# Patient Record
Sex: Female | Born: 1970 | Race: White | Hispanic: No | Marital: Married | Smoking: Never smoker
Health system: Southern US, Community
[De-identification: ages and names within clinical notes are randomized; demographics above are authoritative.]

## PROBLEM LIST (undated history)

## (undated) DIAGNOSIS — Z789 Other specified health status: Secondary | ICD-10-CM

## (undated) DIAGNOSIS — T7840XA Allergy, unspecified, initial encounter: Secondary | ICD-10-CM

## (undated) HISTORY — PX: BREAST EXCISIONAL BIOPSY: SUR124

## (undated) HISTORY — PX: HERNIA REPAIR: SHX51

## (undated) HISTORY — PX: INTRAUTERINE DEVICE (IUD) INSERTION: SHX5877

## (undated) HISTORY — DX: Allergy, unspecified, initial encounter: T78.40XA

---

## 2006-10-01 ENCOUNTER — Other Ambulatory Visit: Admission: RE | Admit: 2006-10-01 | Discharge: 2006-10-01 | Payer: Self-pay | Admitting: Internal Medicine

## 2007-05-20 ENCOUNTER — Encounter: Admission: RE | Admit: 2007-05-20 | Discharge: 2007-05-20 | Payer: Self-pay | Admitting: Internal Medicine

## 2010-10-09 ENCOUNTER — Other Ambulatory Visit: Payer: Self-pay | Admitting: Diagnostic Radiology

## 2010-10-09 DIAGNOSIS — N6320 Unspecified lump in the left breast, unspecified quadrant: Secondary | ICD-10-CM

## 2010-10-10 ENCOUNTER — Ambulatory Visit
Admission: RE | Admit: 2010-10-10 | Discharge: 2010-10-10 | Disposition: A | Discharge status: Home / Self Care | Payer: PRIVATE HEALTH INSURANCE | Source: Ambulatory Visit | Attending: Diagnostic Radiology | Admitting: Diagnostic Radiology

## 2010-10-10 ENCOUNTER — Other Ambulatory Visit: Payer: Self-pay | Admitting: Obstetrics and Gynecology

## 2010-10-10 DIAGNOSIS — N6320 Unspecified lump in the left breast, unspecified quadrant: Secondary | ICD-10-CM

## 2012-03-03 ENCOUNTER — Other Ambulatory Visit: Payer: Self-pay | Admitting: Obstetrics & Gynecology

## 2012-03-03 DIAGNOSIS — Z1231 Encounter for screening mammogram for malignant neoplasm of breast: Secondary | ICD-10-CM

## 2012-03-31 ENCOUNTER — Ambulatory Visit
Admission: RE | Admit: 2012-03-31 | Discharge: 2012-03-31 | Disposition: A | Discharge status: Home / Self Care | Payer: PRIVATE HEALTH INSURANCE | Source: Ambulatory Visit | Attending: Obstetrics & Gynecology | Admitting: Obstetrics & Gynecology

## 2012-03-31 DIAGNOSIS — Z1231 Encounter for screening mammogram for malignant neoplasm of breast: Secondary | ICD-10-CM

## 2012-04-01 ENCOUNTER — Other Ambulatory Visit: Payer: Self-pay | Admitting: Obstetrics & Gynecology

## 2012-04-01 DIAGNOSIS — R928 Other abnormal and inconclusive findings on diagnostic imaging of breast: Secondary | ICD-10-CM

## 2012-04-07 ENCOUNTER — Ambulatory Visit
Admission: RE | Admit: 2012-04-07 | Discharge: 2012-04-07 | Disposition: A | Discharge status: Home / Self Care | Payer: PRIVATE HEALTH INSURANCE | Source: Ambulatory Visit | Attending: Obstetrics & Gynecology | Admitting: Obstetrics & Gynecology

## 2012-04-07 DIAGNOSIS — R928 Other abnormal and inconclusive findings on diagnostic imaging of breast: Secondary | ICD-10-CM

## 2013-05-25 ENCOUNTER — Other Ambulatory Visit: Payer: Self-pay

## 2013-05-25 DIAGNOSIS — Z1231 Encounter for screening mammogram for malignant neoplasm of breast: Secondary | ICD-10-CM

## 2013-06-02 ENCOUNTER — Ambulatory Visit
Admission: RE | Admit: 2013-06-02 | Discharge: 2013-06-02 | Disposition: A | Discharge status: Home / Self Care | Payer: PRIVATE HEALTH INSURANCE | Source: Ambulatory Visit

## 2013-06-02 DIAGNOSIS — Z1231 Encounter for screening mammogram for malignant neoplasm of breast: Secondary | ICD-10-CM

## 2014-12-01 ENCOUNTER — Other Ambulatory Visit: Payer: Self-pay

## 2014-12-01 DIAGNOSIS — Z1231 Encounter for screening mammogram for malignant neoplasm of breast: Secondary | ICD-10-CM

## 2015-01-04 ENCOUNTER — Ambulatory Visit
Admission: RE | Admit: 2015-01-04 | Discharge: 2015-01-04 | Disposition: A | Discharge status: Home / Self Care | Payer: PRIVATE HEALTH INSURANCE | Source: Ambulatory Visit

## 2015-01-04 DIAGNOSIS — Z1231 Encounter for screening mammogram for malignant neoplasm of breast: Secondary | ICD-10-CM

## 2015-01-09 ENCOUNTER — Other Ambulatory Visit: Payer: Self-pay | Admitting: Obstetrics & Gynecology

## 2015-01-09 DIAGNOSIS — R928 Other abnormal and inconclusive findings on diagnostic imaging of breast: Secondary | ICD-10-CM

## 2015-01-16 ENCOUNTER — Ambulatory Visit
Admission: RE | Admit: 2015-01-16 | Discharge: 2015-01-16 | Disposition: A | Discharge status: Home / Self Care | Payer: PRIVATE HEALTH INSURANCE | Source: Ambulatory Visit | Attending: Obstetrics & Gynecology | Admitting: Obstetrics & Gynecology

## 2015-01-16 DIAGNOSIS — R928 Other abnormal and inconclusive findings on diagnostic imaging of breast: Secondary | ICD-10-CM

## 2017-01-21 ENCOUNTER — Other Ambulatory Visit: Payer: Self-pay | Admitting: Obstetrics & Gynecology

## 2017-01-21 DIAGNOSIS — Z1231 Encounter for screening mammogram for malignant neoplasm of breast: Secondary | ICD-10-CM

## 2017-02-25 ENCOUNTER — Ambulatory Visit
Admission: RE | Admit: 2017-02-25 | Discharge: 2017-02-25 | Disposition: A | Discharge status: Home / Self Care | Payer: PRIVATE HEALTH INSURANCE | Source: Ambulatory Visit | Attending: Obstetrics & Gynecology | Admitting: Obstetrics & Gynecology

## 2017-02-25 DIAGNOSIS — Z1231 Encounter for screening mammogram for malignant neoplasm of breast: Secondary | ICD-10-CM

## 2017-03-06 ENCOUNTER — Encounter: Payer: Self-pay | Admitting: Obstetrics & Gynecology

## 2017-03-06 ENCOUNTER — Ambulatory Visit: Payer: PRIVATE HEALTH INSURANCE | Admitting: Obstetrics & Gynecology

## 2017-03-06 VITALS — BP 110/70 | Ht 63.75 in | Wt 147.0 lb

## 2017-03-06 DIAGNOSIS — Z30431 Encounter for routine checking of intrauterine contraceptive device: Secondary | ICD-10-CM | POA: Diagnosis not present

## 2017-03-06 DIAGNOSIS — Z9189 Other specified personal risk factors, not elsewhere classified: Secondary | ICD-10-CM

## 2017-03-06 DIAGNOSIS — Z01419 Encounter for gynecological examination (general) (routine) without abnormal findings: Secondary | ICD-10-CM | POA: Diagnosis not present

## 2017-03-06 NOTE — Progress Notes (Addendum)
Lori Santiago 02-Dec-1970 [id number]   History:    47 y.o.G2P2L2 Married.  Husband is an Medical sales representative.  Vasectomy.  2 daughters 79 and 18 yo  RP:  Established patient presenting  for annual gyn exam   HPI: Well on Mirena IUD since March 2014.  Patient will come back March this year to change the Mirena IUD.  Using it for cycle control.  Husband is vasectomized.  No pelvic pain.  No problem with intercourse.  Urine and bowel movements normal.  Breasts normal.  Fit and eating well.  Health labs with family physician.  Body mass index 25.4.  Past medical history,surgical history, family history and social history were all reviewed and documented in the EPIC chart.  Gynecologic History No LMP recorded. Patient is not currently having periods (Reason: Other). Contraception: Mirena IUD x 04/2012 Last Pap: 11/2015. Results were: Negative, HPV HR neg Last mammogram: 02/2017. Results were: Negative  Obstetric History OB History  Gravida Para Term Preterm AB Living  2 2       2   SAB TAB Ectopic Multiple Live Births               # Outcome Date GA Lbr Len/2nd Weight Sex Delivery Anes PTL Lv  2 Para           1 Para                ROS: A ROS was performed and pertinent positives and negatives are included in the history.  GENERAL: No fevers or chills. HEENT: No change in vision, no earache, sore throat or sinus congestion. NECK: No pain or stiffness. CARDIOVASCULAR: No chest pain or pressure. No palpitations. PULMONARY: No shortness of breath, cough or wheeze. GASTROINTESTINAL: No abdominal pain, nausea, vomiting or diarrhea, melena or bright red blood per rectum. GENITOURINARY: No urinary frequency, urgency, hesitancy or dysuria. MUSCULOSKELETAL: No joint or muscle pain, no back pain, no recent trauma. DERMATOLOGIC: No rash, no itching, no lesions. ENDOCRINE: No polyuria, polydipsia, no heat or cold intolerance. No recent change in weight. HEMATOLOGICAL: No anemia or easy bruising  or bleeding. NEUROLOGIC: No headache, seizures, numbness, tingling or weakness. PSYCHIATRIC: No depression, no loss of interest in normal activity or change in sleep pattern.     Exam:   BP 110/70   Ht 5' 3.75" (1.619 m)   Wt 147 lb (66.7 kg)   BMI 25.43 kg/m   Body mass index is 25.43 kg/m.  General appearance : Well developed well nourished female. No acute distress HEENT: Eyes: no retinal hemorrhage or exudates,  Neck supple, trachea midline, no carotid bruits, no thyroidmegaly Lungs: Clear to auscultation, no rhonchi or wheezes, or rib retractions  Heart: Regular rate and rhythm, no murmurs or gallops Breast:Examined in sitting and supine position were symmetrical in appearance, no palpable masses or tenderness,  no skin retraction, no nipple inversion, no nipple discharge, no skin discoloration, no axillary or supraclavicular lymphadenopathy Abdomen: no palpable masses or tenderness, no rebound or guarding Extremities: no edema or skin discoloration or tenderness  Pelvic: Vulva normal  Bartholin, Urethra, Skene Glands: Within normal limits             Vagina: No gross lesions or discharge  Cervix: No gross lesions or discharge.  Strings felt at [facility].  Uterus RV, normal size, shape and consistency, non-tender and mobile  Adnexa  Without masses or tenderness  Anus and perineum  normal    Assessment/Plan:  47 y.o. female for  annual exam   1. Well female exam with routine gynecological exam Normal gynecologic exam.  Uterus retroverted.  Pap test negative with negative high risk HPV October 2017.  Breast exam normal.  Screening mammogram negative January 2019.  Health labs with family physician.  2. Encounter for routine checking of intrauterine contraceptive device (IUD) Mirena IUD well-tolerated, in good position and controlling cycle well.  Due for change in Marinol IUD March 2019.  Will organize that visit today.  3. Relies on partner vasectomy for  contraception   Princess Bruins MD, 12:34 PM 03/06/2017

## 2017-03-06 NOTE — Patient Instructions (Signed)
1. Well female exam with routine gynecological exam Normal gynecologic exam.  Uterus retroverted.  Pap test negative with negative high risk HPV October 2017.  Breast exam normal.  Screening mammogram negative January 2019.  Health labs with family physician.  2. Encounter for routine checking of intrauterine contraceptive device (IUD) Mirena IUD well-tolerated, in good position and controlling cycle well.  Due for change in Marinol IUD March 2019.  Will organize that visit today.  3. Relies on partner vasectomy for contraception   Lori Santiago, it was a pleasure seeing you today!  Health Maintenance, Female Adopting a healthy lifestyle and getting preventive care can go a long way to promote health and wellness. Talk with your health care provider about what schedule of regular examinations is right for you. This is a good chance for you to check in with your provider about disease prevention and staying healthy. In between checkups, there are plenty of things you can do on your own. Experts have done a lot of research about which lifestyle changes and preventive measures are most likely to keep you healthy. Ask your health care provider for more information. Weight and diet Eat a healthy diet  Be sure to include plenty of vegetables, fruits, low-fat dairy products, and lean protein.  Do not eat a lot of foods high in solid fats, added sugars, or salt.  Get regular exercise. This is one of the most important things you can do for your health. ? Most adults should exercise for at least 150 minutes each week. The exercise should increase your heart rate and make you sweat (moderate-intensity exercise). ? Most adults should also do strengthening exercises at least twice a week. This is in addition to the moderate-intensity exercise.  Maintain a healthy weight  Body mass index (BMI) is a measurement that can be used to identify possible weight problems. It estimates body fat based on height and  weight. Your health care provider can help determine your BMI and help you achieve or maintain a healthy weight.  For females 71 years of age and older: ? A BMI below 18.5 is considered underweight. ? A BMI of 18.5 to 24.9 is normal. ? A BMI of 25 to 29.9 is considered overweight. ? A BMI of 30 and above is considered obese.  Watch levels of cholesterol and blood lipids  You should start having your blood tested for lipids and cholesterol at 47 years of age, then have this test every 5 years.  You may need to have your cholesterol levels checked more often if: ? Your lipid or cholesterol levels are high. ? You are older than 47 years of age. ? You are at high risk for heart disease.  Cancer screening Lung Cancer  Lung cancer screening is recommended for adults 1-12 years old who are at high risk for lung cancer because of a history of smoking.  A yearly low-dose CT scan of the lungs is recommended for people who: ? Currently smoke. ? Have quit within the past 15 years. ? Have at least a 30-pack-year history of smoking. A pack year is smoking an average of one pack of cigarettes a day for 1 year.  Yearly screening should continue until it has been 15 years since you quit.  Yearly screening should stop if you develop a health problem that would prevent you from having lung cancer treatment.  Breast Cancer  Practice breast self-awareness. This means understanding how your breasts normally appear and feel.  It also means  doing regular breast self-exams. Let your health care provider know about any changes, no matter how small.  If you are in your 20s or 30s, you should have a clinical breast exam (CBE) by a health care provider every 1-3 years as part of a regular health exam.  If you are 40 or older, have a CBE every year. Also consider having a breast X-ray (mammogram) every year.  If you have a family history of breast cancer, talk to your health care provider about genetic  screening.  If you are at high risk for breast cancer, talk to your health care provider about having an MRI and a mammogram every year.  Breast cancer gene (BRCA) assessment is recommended for women who have family members with BRCA-related cancers. BRCA-related cancers include: ? Breast. ? Ovarian. ? Tubal. ? Peritoneal cancers.  Results of the assessment will determine the need for genetic counseling and BRCA1 and BRCA2 testing.  Cervical Cancer Your health care provider may recommend that you be screened regularly for cancer of the pelvic organs (ovaries, uterus, and vagina). This screening involves a pelvic examination, including checking for microscopic changes to the surface of your cervix (Pap test). You may be encouraged to have this screening done every 3 years, beginning at age 22.  For women ages 57-65, health care providers may recommend pelvic exams and Pap testing every 3 years, or they may recommend the Pap and pelvic exam, combined with testing for human papilloma virus (HPV), every 5 years. Some types of HPV increase your risk of cervical cancer. Testing for HPV may also be done on women of any age with unclear Pap test results.  Other health care providers may not recommend any screening for nonpregnant women who are considered low risk for pelvic cancer and who do not have symptoms. Ask your health care provider if a screening pelvic exam is right for you.  If you have had past treatment for cervical cancer or a condition that could lead to cancer, you need Pap tests and screening for cancer for at least 20 years after your treatment. If Pap tests have been discontinued, your risk factors (such as having a new sexual partner) need to be reassessed to determine if screening should resume. Some women have medical problems that increase the chance of getting cervical cancer. In these cases, your health care provider may recommend more frequent screening and Pap  tests.  Colorectal Cancer  This type of cancer can be detected and often prevented.  Routine colorectal cancer screening usually begins at 47 years of age and continues through 47 years of age.  Your health care provider may recommend screening at an earlier age if you have risk factors for colon cancer.  Your health care provider may also recommend using home test kits to check for hidden blood in the stool.  A small camera at the end of a tube can be used to examine your colon directly (sigmoidoscopy or colonoscopy). This is done to check for the earliest forms of colorectal cancer.  Routine screening usually begins at age 15.  Direct examination of the colon should be repeated every 5-10 years through 47 years of age. However, you may need to be screened more often if early forms of precancerous polyps or small growths are found.  Skin Cancer  Check your skin from head to toe regularly.  Tell your health care provider about any new moles or changes in moles, especially if there is a change in a  mole's shape or color.  Also tell your health care provider if you have a mole that is larger than the size of a pencil eraser.  Always use sunscreen. Apply sunscreen liberally and repeatedly throughout the day.  Protect yourself by wearing long sleeves, pants, a wide-brimmed hat, and sunglasses whenever you are outside.  Heart disease, diabetes, and high blood pressure  High blood pressure causes heart disease and increases the risk of stroke. High blood pressure is more likely to develop in: ? People who have blood pressure in the high end of the normal range (130-139/85-89 mm Hg). ? People who are overweight or obese. ? People who are African American.  If you are 78-95 years of age, have your blood pressure checked every 3-5 years. If you are 28 years of age or older, have your blood pressure checked every year. You should have your blood pressure measured twice-once when you are at  a hospital or clinic, and once when you are not at a hospital or clinic. Record the average of the two measurements. To check your blood pressure when you are not at a hospital or clinic, you can use: ? An automated blood pressure machine at a pharmacy. ? A home blood pressure monitor.  If you are between 10 years and 62 years old, ask your health care provider if you should take aspirin to prevent strokes.  Have regular diabetes screenings. This involves taking a blood sample to check your fasting blood sugar level. ? If you are at a normal weight and have a low risk for diabetes, have this test once every three years after 47 years of age. ? If you are overweight and have a high risk for diabetes, consider being tested at a younger age or more often. Preventing infection Hepatitis B  If you have a higher risk for hepatitis B, you should be screened for this virus. You are considered at high risk for hepatitis B if: ? You were born in a country where hepatitis B is common. Ask your health care provider which countries are considered high risk. ? Your parents were born in a high-risk country, and you have not been immunized against hepatitis B (hepatitis B vaccine). ? You have HIV or AIDS. ? You use needles to inject street drugs. ? You live with someone who has hepatitis B. ? You have had sex with someone who has hepatitis B. ? You get hemodialysis treatment. ? You take certain medicines for conditions, including cancer, organ transplantation, and autoimmune conditions.  Hepatitis C  Blood testing is recommended for: ? Everyone born from [birth year] through 1965. ? Anyone with known risk factors for hepatitis C.  Sexually transmitted infections (STIs)  You should be screened for sexually transmitted infections (STIs) including gonorrhea and chlamydia if: ? You are sexually active and are younger than 47 years of age. ? You are older than 47 years of age and your health care provider tells  you that you are at risk for this type of infection. ? Your sexual activity has changed since you were last screened and you are at an increased risk for chlamydia or gonorrhea. Ask your health care provider if you are at risk.  If you do not have HIV, but are at risk, it may be recommended that you take a prescription medicine daily to prevent HIV infection. This is called pre-exposure prophylaxis (PrEP). You are considered at risk if: ? You are sexually active and do not regularly use condoms or  know the HIV status of your partner(s). ? You take drugs by injection. ? You are sexually active with a partner who has HIV.  Talk with your health care provider about whether you are at high risk of being infected with HIV. If you choose to begin PrEP, you should first be tested for HIV. You should then be tested every 3 months for as long as you are taking PrEP. Pregnancy  If you are premenopausal and you may become pregnant, ask your health care provider about preconception counseling.  If you may become pregnant, take 400 to 800 micrograms (mcg) of folic acid every day.  If you want to prevent pregnancy, talk to your health care provider about birth control (contraception). Osteoporosis and menopause  Osteoporosis is a disease in which the bones lose minerals and strength with aging. This can result in serious bone fractures. Your risk for osteoporosis can be identified using a bone density scan.  If you are 81 years of age or older, or if you are at risk for osteoporosis and fractures, ask your health care provider if you should be screened.  Ask your health care provider whether you should take a calcium or vitamin D supplement to lower your risk for osteoporosis.  Menopause may have certain physical symptoms and risks.  Hormone replacement therapy may reduce some of these symptoms and risks. Talk to your health care provider about whether hormone replacement therapy is right for  you. Follow these instructions at home:  Schedule regular health, dental, and eye exams.  Stay current with your immunizations.  Do not use any tobacco products including cigarettes, chewing tobacco, or electronic cigarettes.  If you are pregnant, do not drink alcohol.  If you are breastfeeding, limit how much and how often you drink alcohol.  Limit alcohol intake to no more than 1 drink per day for nonpregnant women. One drink equals 12 ounces of beer, 5 ounces of wine, or 1 ounces of hard liquor.  Do not use street drugs.  Do not share needles.  Ask your health care provider for help if you need support or information about quitting drugs.  Tell your health care provider if you often feel depressed.  Tell your health care provider if you have ever been abused or do not feel safe at home. This information is not intended to replace advice given to you by your health care provider. Make sure you discuss any questions you have with your health care provider. Document Released: 08/13/2010 Document Revised: 07/06/2015 Document Reviewed: 11/01/2014 Elsevier Interactive Patient Education  [employer].

## 2017-04-18 ENCOUNTER — Ambulatory Visit: Payer: PRIVATE HEALTH INSURANCE | Admitting: Obstetrics & Gynecology

## 2017-04-18 ENCOUNTER — Encounter: Payer: Self-pay | Admitting: Obstetrics & Gynecology

## 2017-04-18 VITALS — BP 118/78

## 2017-04-18 DIAGNOSIS — Z30433 Encounter for removal and reinsertion of intrauterine contraceptive device: Secondary | ICD-10-CM

## 2017-04-18 NOTE — Patient Instructions (Signed)
1. Encounter for removal and reinsertion of intrauterine contraceptive device (IUD) Removal of Mirena IUD after 5 years.  Reinsertion of a new Mirena IUD without difficulty.  Mirena IUD use for cycle control, as her husband is vasectomized.  Procedures without complication.  Follow-up at 4 weeks for IUD check.  IUD PLACEMENT POST-PROCEDURE INSTRUCTIONS  1. You may take Ibuprofen, Aleve or Tylenol for pain if needed.  Cramping should resolve within in 24 hours.  2. You may have a small amount of spotting.  You should wear a mini pad for the next few days.  3. You may have intercourse after 24 hours.  If you using this for birth control, it is effective immediately.  4. You need to call if you have any pelvic pain, fever, heavy bleeding or foul smelling vaginal discharge.  Irregular bleeding is common the first several months after having an IUD placed. You do not need to call for this reason unless you are concerned.  5. Shower or bathe as normal  6. You should have a follow-up appointment in 4-8 weeks for a re-check to make sure you are not having any problems.

## 2017-04-18 NOTE — Progress Notes (Signed)
    Lori Santiago [birth year] [id number]        47 y.o.  G2P2 Married.  Vasectomy  RP: Time to remove and reinsert a new Mirena IUD  HPI: Well on Mirena IUD x 5 years.  Used for cycle control.   OB History  Gravida Para Term Preterm AB Living  2 2       2   SAB TAB Ectopic Multiple Live Births               # Outcome Date GA Lbr Len/2nd Weight Sex Delivery Anes PTL Lv  2 Para           1 Para               Past medical history,surgical history, problem list, medications, allergies, family history and social history were all reviewed and documented in the EPIC chart.   Directed ROS with pertinent positives and negatives documented in the history of present illness/assessment and plan.  Exam:  Vitals:   04/18/17 1009  BP: 118/78   General appearance:  Normal                                                                    IUD procedure note       Patient presented to the office today for removal and placement of Mirena IUD. The patient had previously been provided with literature information on this method of contraception. The risks benefits and pros and cons were discussed and all her questions were answered. She is fully aware that this form of contraception is 99% effective and is good for 5 years.  Pelvic exam: Vulva normal Vagina: No lesions or discharge Cervix: No lesions or discharge.  IUD strings visible.  IUD removed easily by pulling on strings with a Boseman clamp.  IUD complete/intact.  Shown to patient and discarded. Uterus: RV position, normal size, mobile Adnexa: No masses or tenderness Rectal exam: Not done  The cervix was cleansed with Betadine solution. Hurricane spray on the cervix.  A single-tooth tenaculum was placed on the anterior cervical lip. The IUD was shown to the patient and inserted in a sterile fashion.  Hysterometry with the IUD as being inserted was 7 cm.  The IUD string was trimmed. The single-tooth tenaculum was removed. Patient was  instructed to return back to the office in one month for follow up.       Assessment/Plan:  47 y.o. G2P2   1. Encounter for removal and reinsertion of intrauterine contraceptive device (IUD) Removal of Mirena IUD after 5 years.  Reinsertion of a new Mirena IUD without difficulty.  Mirena IUD use for cycle control, as her husband is vasectomized.  Procedures without complication.  Follow-up at 4 weeks for IUD check.  Princess Bruins MD, 10:14 AM 04/18/2017

## 2017-04-21 ENCOUNTER — Encounter: Payer: Self-pay | Admitting: Anesthesiology

## 2017-05-13 ENCOUNTER — Encounter: Payer: Self-pay | Admitting: Obstetrics & Gynecology

## 2017-05-13 ENCOUNTER — Ambulatory Visit (INDEPENDENT_AMBULATORY_CARE_PROVIDER_SITE_OTHER): Payer: PRIVATE HEALTH INSURANCE | Admitting: Obstetrics & Gynecology

## 2017-05-13 VITALS — BP 122/78

## 2017-05-13 DIAGNOSIS — Z30431 Encounter for routine checking of intrauterine contraceptive device: Secondary | ICD-10-CM | POA: Diagnosis not present

## 2017-05-13 NOTE — Patient Instructions (Signed)
1. IUD check up Mirena IUD well-tolerated, in good position and with no sign of infection.  Patient reassured.  Follow-up annual gynecologic exam January 2020.  Levada Dy, good seeing you today!

## 2017-05-13 NOTE — Progress Notes (Signed)
    Lori Santiago October 03, 1970 [id number]        46 y.o.  G2P2   RP: 4-week IUD check  HPI: Mirena IUD inserted April 18, 2017.  No abnormal bleeding.  Normal vaginal secretions.  No pelvic pain.  No pain with intercourse.  No fever.   OB History  Gravida Para Term Preterm AB Living  2 2       2   SAB TAB Ectopic Multiple Live Births               # Outcome Date GA Lbr Len/2nd Weight Sex Delivery Anes PTL Lv  2 Para           1 Para             Past medical history,surgical history, problem list, medications, allergies, family history and social history were all reviewed and documented in the EPIC chart.   Directed ROS with pertinent positives and negatives documented in the history of present illness/assessment and plan.  Exam:  Vitals:   05/13/17 1428  BP: 122/78   General appearance:  Normal  Abdomen: Normal  Gynecologic exam: Vulva normal.  Speculum: Cervix normal with no erythema.  Strings well seen.  Vagina normal.  Secretions normal.  No blood.   Assessment/Plan:  47 y.o. G2P2   1. IUD check up Mirena IUD well-tolerated, in good position and with no sign of infection.  Patient reassured.  Follow-up annual gynecologic exam January 2020.  Counseling on above issues more than 50% for 15 minutes.  Princess Bruins MD, 2:52 PM 05/13/2017

## 2018-03-06 ENCOUNTER — Ambulatory Visit: Payer: Self-pay | Admitting: Family Medicine

## 2018-03-06 ENCOUNTER — Encounter: Payer: Self-pay | Admitting: Family Medicine

## 2018-03-06 VITALS — BP 110/65 | HR 73 | Temp 98.2°F | Resp 16 | Wt 153.0 lb

## 2018-03-06 DIAGNOSIS — J029 Acute pharyngitis, unspecified: Secondary | ICD-10-CM

## 2018-03-06 LAB — POCT RAPID STREP A (OFFICE): Rapid Strep A Screen: NEGATIVE

## 2018-03-06 NOTE — Progress Notes (Signed)
Lori Santiago is a 48 y.o. female who presents today with 4 days of sore throat and an infrequent non-productive dry cough.  She has attempted treatments of DyQuil and Nyquil with some relief and this has slightly improved her symptoms. She has two teenagers who are otherwise well and denies any household, social or occupational exposures or risk factors.  Review of Systems  Constitutional: Negative for chills, fever and malaise/fatigue.  HENT: Positive for sore throat. Negative for congestion, ear discharge, ear pain and sinus pain.   Eyes: Negative.   Respiratory: Positive for cough. Negative for sputum production and shortness of breath.   Cardiovascular: Negative.  Negative for chest pain.  Gastrointestinal: Negative for abdominal pain, diarrhea, nausea and vomiting.  Genitourinary: Negative for dysuria, frequency, hematuria and urgency.  Musculoskeletal: Negative for myalgias.  Skin: Negative.   Neurological: Negative for headaches.  Endo/Heme/Allergies: Negative.   Psychiatric/Behavioral: Negative.     Lori Santiago has a current medication list which includes the following prescription(s): multivitamin, pseudoeph-doxylamine-dm-apap, and pseudoephedrine-apap-dm. Also has No Known Allergies.  Lori Santiago  has no past medical history on file. Also  has a past surgical history that includes Hernia repair.    O: Vitals:   03/06/18 1046  BP: 110/65  Pulse: 73  Resp: 16  Temp: 98.2 F (36.8 C)  SpO2: 97%     Physical Exam Vitals signs reviewed.  Constitutional:      General: She is not in acute distress.    Appearance: She is well-developed. She is not ill-appearing, toxic-appearing or diaphoretic.  HENT:     Head: Normocephalic.     Salivary Glands: Right salivary gland is not diffusely enlarged or tender. Left salivary gland is not diffusely enlarged or tender.     Right Ear: Hearing, tympanic membrane, ear canal and external ear normal.     Left Ear: Hearing, tympanic membrane, ear  canal and external ear normal.     Nose: Nose normal. No congestion or rhinorrhea.     Right Sinus: No maxillary sinus tenderness or frontal sinus tenderness.     Left Sinus: No maxillary sinus tenderness or frontal sinus tenderness.     Mouth/Throat:     Lips: Pink.     Mouth: Mucous membranes are moist.     Tongue: No lesions. Tongue does not protrude in midline.     Palate: No mass and lesions. Palate does not elevate in midline.     Pharynx: Oropharynx is clear. Uvula midline.     Tonsils: No tonsillar exudate or tonsillar abscesses. Swelling: 0 on the right. 0 on the left.  Neck:     Musculoskeletal: Normal range of motion and neck supple.  Cardiovascular:     Rate and Rhythm: Normal rate and regular rhythm.     Pulses: Normal pulses.     Heart sounds: Normal heart sounds.  Pulmonary:     Effort: Pulmonary effort is normal.     Breath sounds: Normal breath sounds.  Abdominal:     General: Bowel sounds are normal.     Palpations: Abdomen is soft.  Musculoskeletal: Normal range of motion.  Lymphadenopathy:     Head:     Right side of head: Tonsillar adenopathy present. No submental or submandibular adenopathy.     Left side of head: No submental, submandibular or tonsillar adenopathy.     Cervical: No cervical adenopathy.  Neurological:     Mental Status: She is alert and oriented to person, place, and time.  A: 1. Sore throat      P: 1. Sore throat Very normal PE- suspect viral sore throat and discussed supportive care options with patient who agrees with plan and recommendations. Rapid Strep - Negative (centor 1 (no fever, has cough, no exudate, mild lymphnode involvement) Discussed that's ore throat can be precursor of impending illness and if fever develops and/or symptoms persist to bring up at upcoming appointment on 09 Mar 2018. Discussed salt water rinses, warm liquids (tea with honey, lemon, throat lozenges, and schedule tylenol x 48 hours.) - POCT rapid  strep A Results for orders placed or performed in visit on 03/06/18 (from the past 24 hour(s))  POCT rapid strep A     Status: Normal   Collection Time: 03/06/18 10:59 AM  Result Value Ref Range   Rapid Strep A Screen Negative Negative     Other orders - Pseudoephedrine-APAP-DM (DAYQUIL PO); Take by mouth. - Pseudoeph-Doxylamine-DM-APAP (NYQUIL PO); Take by mouth.   Discussed with patient exam findings, suspected diagnosis etiology and  reviewed recommended treatment plan and follow up, including complications and indications for urgent medical follow up and evaluation. Medications including use and indications reviewed with patient. Patient provided relevant patient education on diagnosis and/or relevant related condition that were discussed and reviewed with patient at discharge. Patient verbalized understanding of information provided and agrees with plan of care (POC), all questions answered.

## 2018-03-06 NOTE — Patient Instructions (Signed)
Sore Throat  A sore throat is pain, burning, irritation, or scratchiness in the throat. When you have a sore throat, you may feel pain or tenderness in your throat when you swallow or talk.  Many things can cause a sore throat, including:   An infection.   Seasonal allergies.   Dryness in the air.   Irritants, such as smoke or pollution.   Radiation treatment to the area.   Gastroesophageal reflux disease (GERD).   A tumor.  A sore throat is often the first sign of another sickness. It may happen with other symptoms, such as coughing, sneezing, fever, and swollen neck glands. Most sore throats go away without medical treatment.  Follow these instructions at home:          Take over-the-counter medicines only as told by your health care provider.  ? If your child has a sore throat, do not give your child aspirin because of the association with Reye syndrome.   Drink enough fluids to keep your urine pale yellow.   Rest as needed.   To help with pain, try:  ? Sipping warm liquids, such as broth, herbal tea, or warm water.  ? Eating or drinking cold or frozen liquids, such as frozen ice pops.  ? Gargling with a salt-water mixture 3-4 times a day or as needed. To make a salt-water mixture, completely dissolve -1 tsp (3-6 g) of salt in 1 cup (237 mL) of warm water.  ? Sucking on hard candy or throat lozenges.  ? Putting a cool-mist humidifier in your bedroom at night to moisten the air.  ? Sitting in the bathroom with the door closed for 5-10 minutes while you run hot water in the shower.   Do not use any products that contain nicotine or tobacco, such as cigarettes, e-cigarettes, and chewing tobacco. If you need help quitting, ask your health care provider.   Wash your hands well and often with soap and water. If soap and water are not available, use hand sanitizer.  Contact a health care provider if:   You have a fever for more than 2-3 days.   You have symptoms that last (are persistent) for more than  2-3 days.   Your throat does not get better within 7 days.   You have a fever and your symptoms suddenly get worse.   Your child who is 3 months to 3 years old has a temperature of 102.2F (39C) or higher.  Get help right away if:   You have difficulty breathing.   You cannot swallow fluids, soft foods, or your saliva.   You have increased swelling in your throat or neck.   You have persistent nausea and vomiting.  Summary   A sore throat is pain, burning, irritation, or scratchiness in the throat. Many things can cause a sore throat.   Take over-the-counter medicines only as told by your health care provider. Do not give your child aspirin.   Drink plenty of fluids, and rest as needed.   Contact a health care provider if your symptoms worsen or your sore throat does not get better within 7 days.  This information is not intended to replace advice given to you by your health care provider. Make sure you discuss any questions you have with your health care provider.  Document Released: 03/07/2004 Document Revised: 06/30/2017 Document Reviewed: 06/30/2017  Elsevier Interactive Patient Education  2019 [employer]

## 2018-03-09 ENCOUNTER — Ambulatory Visit (INDEPENDENT_AMBULATORY_CARE_PROVIDER_SITE_OTHER): Payer: BLUE CROSS/BLUE SHIELD | Admitting: Obstetrics & Gynecology

## 2018-03-09 ENCOUNTER — Encounter: Payer: Self-pay | Admitting: Obstetrics & Gynecology

## 2018-03-09 VITALS — BP 108/68 | Ht 63.75 in | Wt 143.0 lb

## 2018-03-09 DIAGNOSIS — Z30431 Encounter for routine checking of intrauterine contraceptive device: Secondary | ICD-10-CM | POA: Diagnosis not present

## 2018-03-09 DIAGNOSIS — Z01419 Encounter for gynecological examination (general) (routine) without abnormal findings: Secondary | ICD-10-CM | POA: Diagnosis not present

## 2018-03-09 NOTE — Progress Notes (Signed)
Lori Santiago [birth year] [id number]   History:    48 y.o. G2P2L2 Married.  Vasectomy.  Daughters 58 and 65 yo.  RP:  Established patient presenting for annual gyn exam   HPI: Well on Mirena IUD inserted 04/18/2017.  No abnormal bleeding, no pelvic pain.  No pain with intercourse.  Urine and bowel movements normal.  Breasts normal.  Body mass index 24.74.  Fit and healthy nutrition.  Health labs with family physician.  Past medical history,surgical history, family history and social history were all reviewed and documented in the EPIC chart.  Gynecologic History No LMP recorded. (Menstrual status: IUD). Contraception: Mirena IUD x 04/2017 Last Pap: 2017. Results were: Normal per patient Last mammogram: 02/2017. Results were: Negative Bone Density: Never Colonoscopy: Never  Obstetric History OB History  Gravida Para Term Preterm AB Living  2 2       2   SAB TAB Ectopic Multiple Live Births               # Outcome Date GA Lbr Len/2nd Weight Sex Delivery Anes PTL Lv  2 Para           1 Para              ROS: A ROS was performed and pertinent positives and negatives are included in the history.  GENERAL: No fevers or chills. HEENT: No change in vision, no earache, sore throat or sinus congestion. NECK: No pain or stiffness. CARDIOVASCULAR: No chest pain or pressure. No palpitations. PULMONARY: No shortness of breath, cough or wheeze. GASTROINTESTINAL: No abdominal pain, nausea, vomiting or diarrhea, melena or bright red blood per rectum. GENITOURINARY: No urinary frequency, urgency, hesitancy or dysuria. MUSCULOSKELETAL: No joint or muscle pain, no back pain, no recent trauma. DERMATOLOGIC: No rash, no itching, no lesions. ENDOCRINE: No polyuria, polydipsia, no heat or cold intolerance. No recent change in weight. HEMATOLOGICAL: No anemia or easy bruising or bleeding. NEUROLOGIC: No headache, seizures, numbness, tingling or weakness. PSYCHIATRIC: No depression, no loss of interest in normal  activity or change in sleep pattern.     Exam:   BP 108/68   Ht 5' 3.75" (1.619 m)   Wt 143 lb (64.9 kg)   BMI 24.74 kg/m   Body mass index is 24.74 kg/m.  General appearance : Well developed well nourished female. No acute distress HEENT: Eyes: no retinal hemorrhage or exudates,  Neck supple, trachea midline, no carotid bruits, no thyroidmegaly Lungs: Clear to auscultation, no rhonchi or wheezes, or rib retractions  Heart: Regular rate and rhythm, no murmurs or gallops Breast:Examined in sitting and supine position were symmetrical in appearance, no palpable masses or tenderness,  no skin retraction, no nipple inversion, no nipple discharge, no skin discoloration, no axillary or supraclavicular lymphadenopathy Abdomen: no palpable masses or tenderness, no rebound or guarding Extremities: no edema or skin discoloration or tenderness  Pelvic: Vulva: Normal             Vagina: No gross lesions or discharge  Cervix: No gross lesions or discharge.  IUD strings visible.  Pap reflex done.  Uterus RV, normal size, shape and consistency, non-tender and mobile  Adnexa  Without masses or tenderness  Anus: Normal   Assessment/Plan:  48 y.o. female for annual exam   1. Encounter for routine gynecological examination with Papanicolaou smear of cervix Normal gynecologic exam.  Pap reflex done.  Breast exam normal.  Screening mammogram January 2019 was negative.  Patient will schedule a mammogram now.  Health labs with family physician. - Pap IG w/ reflex to HPV when ASC-U  2. Encounter for routine checking of intrauterine contraceptive device (IUD) Mirena IUD in good position and well-tolerated.  Controlling cycle well.  Vasectomy for contraception.  Other orders - cholecalciferol (VITAMIN D3) 25 MCG (1000 UT) tablet; Take 1,000 Units by mouth daily.  Princess Bruins MD, 2:33 PM 03/09/2018

## 2018-03-10 ENCOUNTER — Other Ambulatory Visit: Payer: Self-pay | Admitting: Obstetrics & Gynecology

## 2018-03-10 DIAGNOSIS — Z1231 Encounter for screening mammogram for malignant neoplasm of breast: Secondary | ICD-10-CM

## 2018-03-10 DIAGNOSIS — Z01419 Encounter for gynecological examination (general) (routine) without abnormal findings: Secondary | ICD-10-CM | POA: Diagnosis not present

## 2018-03-11 LAB — PAP IG W/ RFLX HPV ASCU

## 2018-03-15 ENCOUNTER — Encounter: Payer: Self-pay | Admitting: Obstetrics & Gynecology

## 2018-03-15 NOTE — Patient Instructions (Signed)
1. Encounter for routine gynecological examination with Papanicolaou smear of cervix Normal gynecologic exam.  Pap reflex done.  Breast exam normal.  Screening mammogram January 2019 was negative.  Patient will schedule a mammogram now.  Health labs with family physician. - Pap IG w/ reflex to HPV when ASC-U  2. Encounter for routine checking of intrauterine contraceptive device (IUD) Mirena IUD in good position and well-tolerated.  Controlling cycle well.  Vasectomy for contraception.  Other orders - cholecalciferol (VITAMIN D3) 25 MCG (1000 UT) tablet  Lori Santiago, it was a pleasure seeing you today!  I will inform you of your results as soon as they are available.

## 2018-04-08 ENCOUNTER — Ambulatory Visit
Admission: RE | Admit: 2018-04-08 | Discharge: 2018-04-08 | Disposition: A | Discharge status: Home / Self Care | Payer: BLUE CROSS/BLUE SHIELD | Source: Ambulatory Visit | Attending: Obstetrics & Gynecology | Admitting: Obstetrics & Gynecology

## 2018-04-08 DIAGNOSIS — Z1231 Encounter for screening mammogram for malignant neoplasm of breast: Secondary | ICD-10-CM | POA: Diagnosis not present

## 2018-04-09 ENCOUNTER — Other Ambulatory Visit: Payer: Self-pay | Admitting: Obstetrics & Gynecology

## 2018-04-09 DIAGNOSIS — R928 Other abnormal and inconclusive findings on diagnostic imaging of breast: Secondary | ICD-10-CM

## 2018-04-14 ENCOUNTER — Ambulatory Visit
Admission: RE | Admit: 2018-04-14 | Discharge: 2018-04-14 | Disposition: A | Discharge status: Home / Self Care | Payer: BLUE CROSS/BLUE SHIELD | Source: Ambulatory Visit | Attending: Obstetrics & Gynecology | Admitting: Obstetrics & Gynecology

## 2018-04-14 ENCOUNTER — Other Ambulatory Visit: Payer: Self-pay | Admitting: Obstetrics & Gynecology

## 2018-04-14 DIAGNOSIS — N6320 Unspecified lump in the left breast, unspecified quadrant: Secondary | ICD-10-CM

## 2018-04-14 DIAGNOSIS — R928 Other abnormal and inconclusive findings on diagnostic imaging of breast: Secondary | ICD-10-CM

## 2018-04-14 DIAGNOSIS — N6321 Unspecified lump in the left breast, upper outer quadrant: Secondary | ICD-10-CM | POA: Diagnosis not present

## 2018-04-14 DIAGNOSIS — N6322 Unspecified lump in the left breast, upper inner quadrant: Secondary | ICD-10-CM | POA: Diagnosis not present

## 2018-04-14 DIAGNOSIS — N6011 Diffuse cystic mastopathy of right breast: Secondary | ICD-10-CM | POA: Diagnosis not present

## 2018-04-17 ENCOUNTER — Other Ambulatory Visit: Payer: Self-pay | Admitting: Obstetrics & Gynecology

## 2018-04-17 ENCOUNTER — Ambulatory Visit
Admission: RE | Admit: 2018-04-17 | Discharge: 2018-04-17 | Disposition: A | Discharge status: Home / Self Care | Payer: BLUE CROSS/BLUE SHIELD | Source: Ambulatory Visit | Attending: Obstetrics & Gynecology | Admitting: Obstetrics & Gynecology

## 2018-04-17 DIAGNOSIS — N6325 Unspecified lump in the left breast, overlapping quadrants: Secondary | ICD-10-CM | POA: Diagnosis not present

## 2018-04-17 DIAGNOSIS — N6320 Unspecified lump in the left breast, unspecified quadrant: Secondary | ICD-10-CM

## 2018-04-17 DIAGNOSIS — N6321 Unspecified lump in the left breast, upper outer quadrant: Secondary | ICD-10-CM | POA: Diagnosis not present

## 2018-04-17 DIAGNOSIS — N6012 Diffuse cystic mastopathy of left breast: Secondary | ICD-10-CM | POA: Diagnosis not present

## 2019-03-10 ENCOUNTER — Other Ambulatory Visit: Payer: Self-pay

## 2019-03-11 ENCOUNTER — Encounter: Payer: Self-pay | Admitting: Obstetrics & Gynecology

## 2019-03-11 ENCOUNTER — Ambulatory Visit (INDEPENDENT_AMBULATORY_CARE_PROVIDER_SITE_OTHER): Payer: BC Managed Care – PPO | Admitting: Obstetrics & Gynecology

## 2019-03-11 VITALS — BP 124/78 | Wt 155.0 lb

## 2019-03-11 DIAGNOSIS — R87618 Other abnormal cytological findings on specimens from cervix uteri: Secondary | ICD-10-CM | POA: Diagnosis not present

## 2019-03-11 DIAGNOSIS — Z01419 Encounter for gynecological examination (general) (routine) without abnormal findings: Secondary | ICD-10-CM

## 2019-03-11 DIAGNOSIS — Z9189 Other specified personal risk factors, not elsewhere classified: Secondary | ICD-10-CM

## 2019-03-11 DIAGNOSIS — Z30431 Encounter for routine checking of intrauterine contraceptive device: Secondary | ICD-10-CM

## 2019-03-11 NOTE — Progress Notes (Signed)
Lori Santiago April 12, 1970 [id number]   History:    49 y.o. G2P2L2 Married.  Vasectomy.  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  RP:  Established patient presenting for annual gyn exam   HPI: Well on Mirena IUD inserted 04/18/2017.  No abnormal bleeding, no pelvic pain.  No pain with intercourse.  Urine and bowel movements normal.  Breasts normal.  Body mass index 24.74 last year, to 26.81 now.  Fit and healthy nutrition.  Health labs with family physician.  Past medical history,surgical history, family history and social history were all reviewed and documented in the EPIC chart.  Gynecologic History Patient's last menstrual period was 03/08/2019 (lmp unknown).  Obstetric History OB History  Gravida Para Term Preterm AB Living  2 2       2   SAB TAB Ectopic Multiple Live Births               # Outcome Date GA Lbr Len/2nd Weight Sex Delivery Anes PTL Lv  2 Para           1 Para              ROS: A ROS was performed and pertinent positives and negatives are included in the history.  GENERAL: No fevers or chills. HEENT: No change in vision, no earache, sore throat or sinus congestion. NECK: No pain or stiffness. CARDIOVASCULAR: No chest pain or pressure. No palpitations. PULMONARY: No shortness of breath, cough or wheeze. GASTROINTESTINAL: No abdominal pain, nausea, vomiting or diarrhea, melena or bright red blood per rectum. GENITOURINARY: No urinary frequency, urgency, hesitancy or dysuria. MUSCULOSKELETAL: No joint or muscle pain, no back pain, no recent trauma. DERMATOLOGIC: No rash, no itching, no lesions. ENDOCRINE: No polyuria, polydipsia, no heat or cold intolerance. No recent change in weight. HEMATOLOGICAL: No anemia or easy bruising or bleeding. NEUROLOGIC: No headache, seizures, numbness, tingling or weakness. PSYCHIATRIC: No depression, no loss of interest in normal activity or change in sleep pattern.     Exam:   BP 124/78 (BP Location: Right Arm, Patient Position:  Sitting, Cuff Size: Normal)   Wt 155 lb (70.3 kg)   LMP 03/08/2019 (LMP Unknown) Comment: spotting  BMI 26.81 kg/m   Body mass index is 26.81 kg/m.  General appearance : Well developed well nourished female. No acute distress HEENT: Eyes: no retinal hemorrhage or exudates,  Neck supple, trachea midline, no carotid bruits, no thyroidmegaly Lungs: Clear to auscultation, no rhonchi or wheezes, or rib retractions  Heart: Regular rate and rhythm, no murmurs or gallops Breast:Examined in sitting and supine position were symmetrical in appearance, no palpable masses or tenderness,  no skin retraction, no nipple inversion, no nipple discharge, no skin discoloration, no axillary or supraclavicular lymphadenopathy Abdomen: no palpable masses or tenderness, no rebound or guarding Extremities: no edema or skin discoloration or tenderness  Pelvic: Vulva: Normal             Vagina: No gross lesions or discharge  Cervix: No gross lesions or discharge.  IUD strings visible at the external os.  Pap reflex done.  Uterus  RV,  normal size, shape and consistency, non-tender and mobile  Adnexa  Without masses or tenderness  Anus: Normal   Assessment/Plan:  49 y.o. female for annual exam   1. Encounter for routine gynecological examination with Papanicolaou smear of cervix Normal gynecologic exam.  Pap reflex done.  Breast exam normal.  Screening mammogram February 2020, then bilateral diagnostic mammogram and ultrasound with  left breast biopsies benign March 2020.  We will schedule a screening mammogram March 2021.  Health labs with family physician.  Body mass index 26.81.  Continue with fitness and healthy nutrition.  2. Relies on partner vasectomy for contraception  3. Encounter for routine checking of intrauterine contraceptive device (IUD) Mirena IUD for cycle control x 04/2017.  IUD in good position and controlling the cycle well.  Princess Bruins MD, 2:32 PM 03/11/2019

## 2019-03-11 NOTE — Patient Instructions (Signed)
1. Encounter for routine gynecological examination with Papanicolaou smear of cervix Normal gynecologic exam.  Pap reflex done.  Breast exam normal.  Screening mammogram February 2020, then bilateral diagnostic mammogram and ultrasound with left breast biopsies benign March 2020.  We will schedule a screening mammogram March 2021.  Health labs with family physician.  Body mass index 26.81.  Continue with fitness and healthy nutrition.  2. Relies on partner vasectomy for contraception  3. Encounter for routine checking of intrauterine contraceptive device (IUD) Mirena IUD for cycle control x 04/2017.  IUD in good position and controlling the cycle well.  Lori Santiago, it was a pleasure seeing you today!  I will inform you of your results as soon as they are available.

## 2019-03-12 LAB — PAP IG W/ RFLX HPV ASCU

## 2019-05-10 ENCOUNTER — Other Ambulatory Visit: Payer: Self-pay | Admitting: Obstetrics & Gynecology

## 2019-05-10 DIAGNOSIS — Z1231 Encounter for screening mammogram for malignant neoplasm of breast: Secondary | ICD-10-CM

## 2019-05-11 ENCOUNTER — Other Ambulatory Visit: Payer: Self-pay

## 2019-05-11 ENCOUNTER — Ambulatory Visit
Admission: RE | Admit: 2019-05-11 | Discharge: 2019-05-11 | Disposition: A | Discharge status: Home / Self Care | Payer: BC Managed Care – PPO | Source: Ambulatory Visit | Attending: Obstetrics & Gynecology | Admitting: Obstetrics & Gynecology

## 2019-05-11 DIAGNOSIS — Z1231 Encounter for screening mammogram for malignant neoplasm of breast: Secondary | ICD-10-CM | POA: Diagnosis not present

## 2019-06-30 DIAGNOSIS — D2262 Melanocytic nevi of left upper limb, including shoulder: Secondary | ICD-10-CM | POA: Diagnosis not present

## 2019-06-30 DIAGNOSIS — L718 Other rosacea: Secondary | ICD-10-CM | POA: Diagnosis not present

## 2019-06-30 DIAGNOSIS — L858 Other specified epidermal thickening: Secondary | ICD-10-CM | POA: Diagnosis not present

## 2019-06-30 DIAGNOSIS — D2239 Melanocytic nevi of other parts of face: Secondary | ICD-10-CM | POA: Diagnosis not present

## 2020-03-16 ENCOUNTER — Encounter: Payer: BC Managed Care – PPO | Admitting: Obstetrics & Gynecology

## 2020-03-23 ENCOUNTER — Ambulatory Visit (INDEPENDENT_AMBULATORY_CARE_PROVIDER_SITE_OTHER): Payer: BC Managed Care – PPO | Admitting: Obstetrics & Gynecology

## 2020-03-23 ENCOUNTER — Encounter: Payer: Self-pay | Admitting: Obstetrics & Gynecology

## 2020-03-23 ENCOUNTER — Other Ambulatory Visit: Payer: Self-pay

## 2020-03-23 ENCOUNTER — Telehealth: Payer: Self-pay | Admitting: *Deleted

## 2020-03-23 VITALS — BP 110/78 | HR 80 | Resp 18 | Ht 63.98 in | Wt 128.8 lb

## 2020-03-23 DIAGNOSIS — Z9189 Other specified personal risk factors, not elsewhere classified: Secondary | ICD-10-CM

## 2020-03-23 DIAGNOSIS — N6323 Unspecified lump in the left breast, lower outer quadrant: Secondary | ICD-10-CM | POA: Diagnosis not present

## 2020-03-23 DIAGNOSIS — Z30431 Encounter for routine checking of intrauterine contraceptive device: Secondary | ICD-10-CM | POA: Diagnosis not present

## 2020-03-23 DIAGNOSIS — B373 Candidiasis of vulva and vagina: Secondary | ICD-10-CM

## 2020-03-23 DIAGNOSIS — Z01419 Encounter for gynecological examination (general) (routine) without abnormal findings: Secondary | ICD-10-CM

## 2020-03-23 DIAGNOSIS — B3731 Acute candidiasis of vulva and vagina: Secondary | ICD-10-CM

## 2020-03-23 MED ORDER — FLUCONAZOLE 150 MG PO TABS: 150.0000 mg | ORAL_TABLET | Freq: Every day | ORAL | 2 refills | Status: AC

## 2020-03-23 NOTE — Progress Notes (Signed)
Lori Santiago Sep 14, 1970 [id number]   History:    50 y.o.  G2P2L2 Married. Vasectomy.  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  JH:[id number] presenting for annual gyn exam   FWY:OVZC on Mirena IUD inserted3/09/2017.No abnormal bleeding, no pelvic pain. No pain with intercourse. Urine and bowel movements normal. Breasts normal. Body mass index improved to 22.13. Fit and healthy nutrition. 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 Health labs with family physician.  Past medical history,surgical history, family history and social history were all reviewed and documented in the EPIC chart.  Gynecologic History No LMP recorded. (Menstrual status: IUD).  Obstetric History OB History  Gravida Para Term Preterm AB Living  2 2       2   SAB IAB Ectopic Multiple Live Births               # Outcome Date GA Lbr Len/2nd Weight Sex Delivery Anes PTL Lv  2 Para           1 Para              ROS: A ROS was performed and pertinent positives and negatives are included in the history.  GENERAL: No fevers or chills. HEENT: No change in vision, no earache, sore throat or sinus congestion. NECK: No pain or stiffness. CARDIOVASCULAR: No chest pain or pressure. No palpitations. PULMONARY: No shortness of breath, cough or wheeze. GASTROINTESTINAL: No abdominal pain, nausea, vomiting or diarrhea, melena or bright red blood per rectum. GENITOURINARY: No urinary frequency, urgency, hesitancy or dysuria. MUSCULOSKELETAL: No joint or muscle pain, no back pain, no recent trauma. DERMATOLOGIC: No rash, no itching, no lesions. ENDOCRINE: No polyuria, polydipsia, no heat or cold intolerance. No recent change in weight. HEMATOLOGICAL: No anemia or easy bruising or bleeding. NEUROLOGIC: No headache, seizures, numbness, tingling or weakness. PSYCHIATRIC: No depression, no loss of interest in normal activity or change in  sleep pattern.     Exam:   BP 110/78 (BP Location: Right Arm, Patient Position: Sitting)   Pulse 80   Resp 18   Ht 5' 3.98" (1.625 m)   Wt 128 lb 12.8 oz (58.4 kg)   BMI 22.13 kg/m   Body mass index is 22.13 kg/m.  General appearance : Well developed well nourished female. No acute distress HEENT: Eyes: no retinal hemorrhage or exudates,  Neck supple, trachea midline, no carotid bruits, no thyroidmegaly Lungs: Clear to auscultation, no rhonchi or wheezes, or rib retractions  Heart: Regular rate and rhythm, no murmurs or gallops Breast:Examined in sitting and supine position were symmetrical in appearance.  Rt breast: no palpable masses or tenderness,  no skin retraction, no nipple inversion, no nipple discharge, no skin discoloration, no axillary or supraclavicular lymphadenopathy.  Lt breast: palpable soft, mobile, 1 cm nodule at 4 O'Clock,  no skin retraction, no nipple inversion, no nipple discharge, no skin discoloration, no axillary or supraclavicular lymphadenopathy  Abdomen: no palpable masses or tenderness, no rebound or guarding Extremities: no edema or skin discoloration or tenderness  Pelvic: Vulva: Normal             Vagina: No gross lesions or discharge  Cervix: No gross lesions or discharge.  IUD strings visible at [facility].    Uterus  AV, normal size, shape and consistency, non-tender and mobile  Adnexa  Without masses or tenderness  Anus: Normal   Assessment/Plan:  50 y.o. female for annual exam  1. Encounter for routine gynecological examination with Papanicolaou smear of cervix Normal gynecologic exam.  No indication for Pap test this year.  Breast exam with a small probable cyst at 4:00 on the left breast.  We will proceed with a left diagnostic mammogram with ultrasound and a right screening mammogram.  Health labs with Dr. Virgina Jock.  Body mass index 22.13.  Continue with fitness and healthy nutrition.  2. Relies on partner vasectomy for contraception  3.  Encounter for routine checking of intrauterine contraceptive device (IUD) Well on Mirena IUD for cycle control.  IUD in good position.  4. Breast lump on left side at 4 o'clock position Small breast lump on the left at 4:00.  Compatible with a breast cyst.  Will rule out more significant pathology with a left diagnostic mammogram/ultrasound.  Will do when due for her screening mammogram March 2022.  5. Yeast vaginitis Occasional mild symptoms of yeast vaginitis.  Fluconazole 150 mg/tab, 1 tablet daily for 3 days as needed.  Prescription sent to pharmacy.  Other orders - fluconazole (DIFLUCAN) 150 MG tablet; Take 1 tablet (150 mg total) by mouth daily for 3 days.  Princess Bruins MD, 11:19 AM 03/23/2020

## 2020-03-23 NOTE — Telephone Encounter (Signed)
-----   Message from Princess Bruins, MD sent at 03/23/2020 11:39 AM EST ----- Regarding: Refer for Left Dx mammo/Breast US with Rt Screening mammo Left small soft nodule (probable cyst) 1 cm at 4 O'Clock.  Please schedule Lt Dx mammo/US when due for screening mammo 04/2020.

## 2020-03-23 NOTE — Telephone Encounter (Signed)
Patient scheduled at the breast center 04/05/20 @ 1:40pm. Patient informed

## 2020-04-05 ENCOUNTER — Other Ambulatory Visit: Payer: Self-pay

## 2020-04-05 ENCOUNTER — Other Ambulatory Visit: Payer: Self-pay | Admitting: Obstetrics & Gynecology

## 2020-04-05 ENCOUNTER — Ambulatory Visit
Admission: RE | Admit: 2020-04-05 | Discharge: 2020-04-05 | Disposition: A | Discharge status: Home / Self Care | Payer: BC Managed Care – PPO | Source: Ambulatory Visit | Attending: Obstetrics & Gynecology | Admitting: Obstetrics & Gynecology

## 2020-04-05 DIAGNOSIS — N6489 Other specified disorders of breast: Secondary | ICD-10-CM | POA: Diagnosis not present

## 2020-04-05 DIAGNOSIS — R928 Other abnormal and inconclusive findings on diagnostic imaging of breast: Secondary | ICD-10-CM | POA: Diagnosis not present

## 2020-04-05 DIAGNOSIS — N6323 Unspecified lump in the left breast, lower outer quadrant: Secondary | ICD-10-CM

## 2020-04-06 ENCOUNTER — Ambulatory Visit
Admission: RE | Admit: 2020-04-06 | Discharge: 2020-04-06 | Disposition: A | Discharge status: Home / Self Care | Payer: BC Managed Care – PPO | Source: Ambulatory Visit | Attending: Obstetrics & Gynecology | Admitting: Obstetrics & Gynecology

## 2020-04-06 DIAGNOSIS — D242 Benign neoplasm of left breast: Secondary | ICD-10-CM | POA: Diagnosis not present

## 2020-04-06 DIAGNOSIS — N6323 Unspecified lump in the left breast, lower outer quadrant: Secondary | ICD-10-CM

## 2020-05-11 ENCOUNTER — Other Ambulatory Visit: Payer: Self-pay | Admitting: General Surgery

## 2020-05-11 DIAGNOSIS — D242 Benign neoplasm of left breast: Secondary | ICD-10-CM | POA: Diagnosis not present

## 2020-05-16 ENCOUNTER — Other Ambulatory Visit: Payer: Self-pay | Admitting: General Surgery

## 2020-05-16 DIAGNOSIS — D242 Benign neoplasm of left breast: Secondary | ICD-10-CM

## 2020-05-24 ENCOUNTER — Encounter (HOSPITAL_BASED_OUTPATIENT_CLINIC_OR_DEPARTMENT_OTHER): Payer: Self-pay | Admitting: General Surgery

## 2020-05-24 ENCOUNTER — Other Ambulatory Visit: Payer: Self-pay

## 2020-05-29 ENCOUNTER — Other Ambulatory Visit (HOSPITAL_COMMUNITY)
Admission: RE | Admit: 2020-05-29 | Discharge: 2020-05-29 | Disposition: A | Discharge status: Home / Self Care | Payer: BC Managed Care – PPO | Source: Ambulatory Visit | Attending: General Surgery | Admitting: General Surgery

## 2020-05-29 DIAGNOSIS — Z8261 Family history of arthritis: Secondary | ICD-10-CM | POA: Diagnosis not present

## 2020-05-29 DIAGNOSIS — Z01812 Encounter for preprocedural laboratory examination: Secondary | ICD-10-CM | POA: Insufficient documentation

## 2020-05-29 DIAGNOSIS — D242 Benign neoplasm of left breast: Secondary | ICD-10-CM | POA: Diagnosis not present

## 2020-05-29 DIAGNOSIS — Z833 Family history of diabetes mellitus: Secondary | ICD-10-CM | POA: Diagnosis not present

## 2020-05-29 DIAGNOSIS — Z20822 Contact with and (suspected) exposure to covid-19: Secondary | ICD-10-CM | POA: Insufficient documentation

## 2020-05-29 DIAGNOSIS — Z8249 Family history of ischemic heart disease and other diseases of the circulatory system: Secondary | ICD-10-CM | POA: Diagnosis not present

## 2020-05-29 DIAGNOSIS — Z818 Family history of other mental and behavioral disorders: Secondary | ICD-10-CM | POA: Diagnosis not present

## 2020-05-29 LAB — SARS CORONAVIRUS 2 (TAT 6-24 HRS): SARS Coronavirus 2: NEGATIVE

## 2020-05-31 ENCOUNTER — Other Ambulatory Visit: Payer: Self-pay

## 2020-05-31 ENCOUNTER — Ambulatory Visit
Admission: RE | Admit: 2020-05-31 | Discharge: 2020-05-31 | Disposition: A | Discharge status: Home / Self Care | Payer: BC Managed Care – PPO | Source: Ambulatory Visit | Attending: General Surgery | Admitting: General Surgery

## 2020-05-31 DIAGNOSIS — R928 Other abnormal and inconclusive findings on diagnostic imaging of breast: Secondary | ICD-10-CM | POA: Diagnosis not present

## 2020-05-31 DIAGNOSIS — D242 Benign neoplasm of left breast: Secondary | ICD-10-CM

## 2020-05-31 NOTE — Progress Notes (Signed)
      Enhanced Recovery after Surgery for Orthopedics Enhanced Recovery after Surgery is a protocol used to improve the stress on your body and your recovery after surgery.  Patient Instructions  . The night before surgery:  o No food after midnight. ONLY clear liquids after midnight  . The day of surgery (if you do NOT have diabetes):  o Drink ONE (1) Pre-Surgery Clear Ensure as directed.   o This drink was given to you during your hospital  pre-op appointment visit. o The pre-op nurse will instruct you on the time to drink the  Pre-Surgery Ensure depending on your surgery time. o Finish the drink at the designated time by the pre-op nurse.  o Nothing else to drink after completing the  Pre-Surgery Clear Ensure.  . The day of surgery (if you have diabetes): o Drink ONE (1) Gatorade 2 (G2) as directed. o This drink was given to you during your hospital  pre-op appointment visit.  o The pre-op nurse will instruct you on the time to drink the   Gatorade 2 (G2) depending on your surgery time. o Color of the Gatorade may vary. Red is not allowed. o Nothing else to drink after completing the  Gatorade 2 (G2).         If you have questions, please contact your surgeon's office.   Surgical soap given to patient along with ERAS drink. Patient verbalizes understanding.

## 2020-06-01 ENCOUNTER — Ambulatory Visit (HOSPITAL_BASED_OUTPATIENT_CLINIC_OR_DEPARTMENT_OTHER): Payer: BC Managed Care – PPO | Admitting: Anesthesiology

## 2020-06-01 ENCOUNTER — Other Ambulatory Visit: Payer: Self-pay

## 2020-06-01 ENCOUNTER — Ambulatory Visit
Admission: RE | Admit: 2020-06-01 | Discharge: 2020-06-01 | Disposition: A | Discharge status: Home / Self Care | Payer: BC Managed Care – PPO | Source: Ambulatory Visit | Attending: General Surgery | Admitting: General Surgery

## 2020-06-01 ENCOUNTER — Encounter (HOSPITAL_BASED_OUTPATIENT_CLINIC_OR_DEPARTMENT_OTHER): Payer: Self-pay | Admitting: General Surgery

## 2020-06-01 ENCOUNTER — Encounter (HOSPITAL_BASED_OUTPATIENT_CLINIC_OR_DEPARTMENT_OTHER)
Admission: RE | Disposition: A | Discharge status: Home / Self Care | Payer: Self-pay | Source: Home / Self Care | Attending: General Surgery

## 2020-06-01 ENCOUNTER — Ambulatory Visit (HOSPITAL_BASED_OUTPATIENT_CLINIC_OR_DEPARTMENT_OTHER)
Admission: RE | Admit: 2020-06-01 | Discharge: 2020-06-01 | Disposition: A | Discharge status: Home / Self Care | Payer: BC Managed Care – PPO | Attending: General Surgery | Admitting: General Surgery

## 2020-06-01 DIAGNOSIS — Z818 Family history of other mental and behavioral disorders: Secondary | ICD-10-CM | POA: Insufficient documentation

## 2020-06-01 DIAGNOSIS — Z833 Family history of diabetes mellitus: Secondary | ICD-10-CM | POA: Diagnosis not present

## 2020-06-01 DIAGNOSIS — D242 Benign neoplasm of left breast: Secondary | ICD-10-CM

## 2020-06-01 DIAGNOSIS — Z8249 Family history of ischemic heart disease and other diseases of the circulatory system: Secondary | ICD-10-CM | POA: Diagnosis not present

## 2020-06-01 DIAGNOSIS — R928 Other abnormal and inconclusive findings on diagnostic imaging of breast: Secondary | ICD-10-CM | POA: Diagnosis not present

## 2020-06-01 DIAGNOSIS — Z8261 Family history of arthritis: Secondary | ICD-10-CM | POA: Insufficient documentation

## 2020-06-01 DIAGNOSIS — N6012 Diffuse cystic mastopathy of left breast: Secondary | ICD-10-CM | POA: Diagnosis not present

## 2020-06-01 DIAGNOSIS — Z20822 Contact with and (suspected) exposure to covid-19: Secondary | ICD-10-CM | POA: Diagnosis not present

## 2020-06-01 HISTORY — PX: RADIOACTIVE SEED GUIDED EXCISIONAL BREAST BIOPSY: SHX6490

## 2020-06-01 HISTORY — DX: Other specified health status: Z78.9

## 2020-06-01 LAB — POCT PREGNANCY, URINE: Preg Test, Ur: NEGATIVE

## 2020-06-01 SURGERY — RADIOACTIVE SEED GUIDED BREAST BIOPSY
Anesthesia: General | Site: Breast | Laterality: Left

## 2020-06-01 MED ORDER — OXYCODONE HCL 5 MG PO TABS: 5.0000 mg | ORAL_TABLET | Freq: Once | ORAL | Status: DC | PRN

## 2020-06-01 MED ORDER — ENSURE PRE-SURGERY PO LIQD: 296.0000 mL | Freq: Once | ORAL | Status: DC

## 2020-06-01 MED ORDER — BUPIVACAINE HCL (PF) 0.25 % IJ SOLN
INTRAMUSCULAR | Status: DC | PRN
  Administered 2020-06-01: 10 mL

## 2020-06-01 MED ORDER — ONDANSETRON HCL 4 MG/2ML IJ SOLN: 4.0000 mg | Freq: Once | INTRAMUSCULAR | Status: DC | PRN

## 2020-06-01 MED ORDER — MIDAZOLAM HCL 2 MG/2ML IJ SOLN
INTRAMUSCULAR | Status: AC
  Filled 2020-06-01: qty 2

## 2020-06-01 MED ORDER — PROPOFOL 10 MG/ML IV BOLUS
INTRAVENOUS | Status: AC
  Filled 2020-06-01: qty 20

## 2020-06-01 MED ORDER — LIDOCAINE HCL (CARDIAC) PF 100 MG/5ML IV SOSY
PREFILLED_SYRINGE | INTRAVENOUS | Status: DC | PRN
  Administered 2020-06-01: 40 mg via INTRAVENOUS

## 2020-06-01 MED ORDER — ACETAMINOPHEN 500 MG PO TABS
ORAL_TABLET | ORAL | Status: AC
  Filled 2020-06-01: qty 2

## 2020-06-01 MED ORDER — FENTANYL CITRATE (PF) 100 MCG/2ML IJ SOLN
INTRAMUSCULAR | Status: AC
  Filled 2020-06-01: qty 2

## 2020-06-01 MED ORDER — FENTANYL CITRATE (PF) 100 MCG/2ML IJ SOLN: 25.0000 ug | INTRAMUSCULAR | Status: DC | PRN

## 2020-06-01 MED ORDER — KETOROLAC TROMETHAMINE 15 MG/ML IJ SOLN
15.0000 mg | INTRAMUSCULAR | Status: AC
  Administered 2020-06-01: 15 mg via INTRAVENOUS

## 2020-06-01 MED ORDER — CEFAZOLIN SODIUM-DEXTROSE 2-3 GM-%(50ML) IV SOLR
INTRAVENOUS | Status: DC | PRN
  Administered 2020-06-01: 2 g via INTRAVENOUS

## 2020-06-01 MED ORDER — ONDANSETRON HCL 4 MG/2ML IJ SOLN
INTRAMUSCULAR | Status: AC
  Filled 2020-06-01: qty 2

## 2020-06-01 MED ORDER — CEFAZOLIN SODIUM-DEXTROSE 2-4 GM/100ML-% IV SOLN: 2.0000 g | INTRAVENOUS | Status: DC

## 2020-06-01 MED ORDER — ONDANSETRON HCL 4 MG/2ML IJ SOLN
INTRAMUSCULAR | Status: DC | PRN
  Administered 2020-06-01: 4 mg via INTRAVENOUS

## 2020-06-01 MED ORDER — ACETAMINOPHEN 500 MG PO TABS
1000.0000 mg | ORAL_TABLET | ORAL | Status: AC
  Administered 2020-06-01: 1000 mg via ORAL

## 2020-06-01 MED ORDER — DEXAMETHASONE SODIUM PHOSPHATE 4 MG/ML IJ SOLN
INTRAMUSCULAR | Status: DC | PRN
  Administered 2020-06-01: 5 mg via INTRAVENOUS

## 2020-06-01 MED ORDER — PROPOFOL 10 MG/ML IV BOLUS
INTRAVENOUS | Status: DC | PRN
  Administered 2020-06-01: 160 mg via INTRAVENOUS

## 2020-06-01 MED ORDER — FENTANYL CITRATE (PF) 100 MCG/2ML IJ SOLN
INTRAMUSCULAR | Status: DC | PRN
  Administered 2020-06-01 (×2): 25 ug via INTRAVENOUS
  Administered 2020-06-01: 50 ug via INTRAVENOUS

## 2020-06-01 MED ORDER — KETOROLAC TROMETHAMINE 15 MG/ML IJ SOLN
INTRAMUSCULAR | Status: AC
  Filled 2020-06-01: qty 1

## 2020-06-01 MED ORDER — DEXAMETHASONE SODIUM PHOSPHATE 10 MG/ML IJ SOLN
INTRAMUSCULAR | Status: AC
  Filled 2020-06-01: qty 1

## 2020-06-01 MED ORDER — LACTATED RINGERS IV SOLN: INTRAVENOUS | Status: DC

## 2020-06-01 MED ORDER — CEFAZOLIN SODIUM-DEXTROSE 2-4 GM/100ML-% IV SOLN
INTRAVENOUS | Status: AC
  Filled 2020-06-01: qty 100

## 2020-06-01 MED ORDER — EPHEDRINE SULFATE 50 MG/ML IJ SOLN
INTRAMUSCULAR | Status: DC | PRN
  Administered 2020-06-01: 10 mg via INTRAVENOUS

## 2020-06-01 MED ORDER — MIDAZOLAM HCL 5 MG/5ML IJ SOLN
INTRAMUSCULAR | Status: DC | PRN
  Administered 2020-06-01: 2 mg via INTRAVENOUS

## 2020-06-01 MED ORDER — OXYCODONE HCL 5 MG/5ML PO SOLN: 5.0000 mg | Freq: Once | ORAL | Status: DC | PRN

## 2020-06-01 SURGICAL SUPPLY — 55 items
ADH SKN CLS APL DERMABOND .7 (GAUZE/BANDAGES/DRESSINGS) ×1
APL PRP STRL LF DISP 70% ISPRP (MISCELLANEOUS) ×1
APPLIER CLIP 9.375 MED OPEN (MISCELLANEOUS)
APR CLP MED 9.3 20 MLT OPN (MISCELLANEOUS)
BINDER BREAST LRG (GAUZE/BANDAGES/DRESSINGS) IMPLANT
BINDER BREAST MEDIUM (GAUZE/BANDAGES/DRESSINGS) IMPLANT
BINDER BREAST XLRG (GAUZE/BANDAGES/DRESSINGS) IMPLANT
BINDER BREAST XXLRG (GAUZE/BANDAGES/DRESSINGS) IMPLANT
BLADE SURG 15 STRL LF DISP TIS (BLADE) ×1 IMPLANT
BLADE SURG 15 STRL SS (BLADE) ×2
CANISTER SUC SOCK COL 7IN (MISCELLANEOUS) IMPLANT
CANISTER SUCT 1200ML W/VALVE (MISCELLANEOUS) IMPLANT
CHLORAPREP W/TINT 26 (MISCELLANEOUS) ×2 IMPLANT
CLIP APPLIE 9.375 MED OPEN (MISCELLANEOUS) IMPLANT
CLIP VESOCCLUDE SM WIDE 6/CT (CLIP) IMPLANT
COVER BACK TABLE 60X90IN (DRAPES) ×2 IMPLANT
COVER MAYO STAND STRL (DRAPES) ×2 IMPLANT
COVER PROBE W GEL 5X96 (DRAPES) ×2 IMPLANT
COVER WAND RF STERILE (DRAPES) IMPLANT
DECANTER SPIKE VIAL GLASS SM (MISCELLANEOUS) IMPLANT
DERMABOND ADVANCED (GAUZE/BANDAGES/DRESSINGS) ×1
DERMABOND ADVANCED .7 DNX12 (GAUZE/BANDAGES/DRESSINGS) ×1 IMPLANT
DRAPE LAPAROSCOPIC ABDOMINAL (DRAPES) ×2 IMPLANT
DRAPE UTILITY XL STRL (DRAPES) ×2 IMPLANT
DRSG TEGADERM 4X4.75 (GAUZE/BANDAGES/DRESSINGS) IMPLANT
ELECT COATED BLADE 2.86 ST (ELECTRODE) ×2 IMPLANT
ELECT REM PT RETURN 9FT ADLT (ELECTROSURGICAL) ×2
ELECTRODE REM PT RTRN 9FT ADLT (ELECTROSURGICAL) ×1 IMPLANT
GAUZE SPONGE 4X4 12PLY STRL LF (GAUZE/BANDAGES/DRESSINGS) IMPLANT
GLOVE SURG ENC MOIS LTX SZ7 (GLOVE) ×4 IMPLANT
GLOVE SURG UNDER POLY LF SZ7.5 (GLOVE) ×2 IMPLANT
GOWN STRL REUS W/ TWL LRG LVL3 (GOWN DISPOSABLE) ×2 IMPLANT
GOWN STRL REUS W/TWL LRG LVL3 (GOWN DISPOSABLE) ×4
HEMOSTAT ARISTA ABSORB 3G PWDR (HEMOSTASIS) IMPLANT
KIT MARKER MARGIN INK (KITS) ×2 IMPLANT
NEEDLE HYPO 25X1 1.5 SAFETY (NEEDLE) ×2 IMPLANT
NS IRRIG 1000ML POUR BTL (IV SOLUTION) IMPLANT
PACK BASIN DAY SURGERY FS (CUSTOM PROCEDURE TRAY) ×2 IMPLANT
PENCIL SMOKE EVACUATOR (MISCELLANEOUS) ×2 IMPLANT
RETRACTOR ONETRAX LX 90X20 (MISCELLANEOUS) IMPLANT
SLEEVE SCD COMPRESS KNEE MED (STOCKING) ×2 IMPLANT
SPONGE LAP 4X18 RFD (DISPOSABLE) ×2 IMPLANT
STRIP CLOSURE SKIN 1/2X4 (GAUZE/BANDAGES/DRESSINGS) ×2 IMPLANT
SUT MNCRL AB 4-0 PS2 18 (SUTURE) IMPLANT
SUT MON AB 5-0 PS2 18 (SUTURE) IMPLANT
SUT SILK 2 0 SH (SUTURE) IMPLANT
SUT VIC AB 2-0 SH 27 (SUTURE) ×2
SUT VIC AB 2-0 SH 27XBRD (SUTURE) ×1 IMPLANT
SUT VIC AB 3-0 SH 27 (SUTURE) ×2
SUT VIC AB 3-0 SH 27X BRD (SUTURE) ×1 IMPLANT
SYR CONTROL 10ML LL (SYRINGE) ×2 IMPLANT
TOWEL GREEN STERILE FF (TOWEL DISPOSABLE) ×2 IMPLANT
TRAY FAXITRON CT DISP (TRAY / TRAY PROCEDURE) ×2 IMPLANT
TUBE CONNECTING 20X1/4 (TUBING) IMPLANT
YANKAUER SUCT BULB TIP NO VENT (SUCTIONS) IMPLANT

## 2020-06-01 NOTE — Anesthesia Procedure Notes (Signed)
Procedure Name: LMA Insertion Date/Time: 06/01/2020 12:38 PM Performed by: Maryella Shivers, CRNA Pre-anesthesia Checklist: Patient identified, Emergency Drugs available, Suction available and Patient being monitored Patient Re-evaluated:Patient Re-evaluated prior to induction Oxygen Delivery Method: Circle system utilized Preoxygenation: Pre-oxygenation with 100% oxygen Induction Type: IV induction Ventilation: Mask ventilation without difficulty LMA: LMA inserted LMA Size: 4.0 Number of attempts: 1 Airway Equipment and Method: Bite block Placement Confirmation: positive ETCO2 Tube secured with: Tape Dental Injury: Teeth and Oropharynx as per pre-operative assessment

## 2020-06-01 NOTE — H&P (Signed)
50 yof who has no family history who had a mass noted in left breast by her gyn. she then underwent imaging. density is b. she was noted to have two prior biopsy clips present. US showed a 1.2x0.5x0.9 cm mass with dilated duct present. there is no ax adenopathy. 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 she does not note this mass. she has no dc. she has no fh of breast, ovarian, prostate or pancreatic cancer. she is here to discuss options her husband is Dr Markus Daft who I work with   Past Surgical History Janeann Forehand, CNA; 05/11/2020 1:32 PM) Breast Biopsy  Left. Open Inguinal Hernia Surgery  Bilateral. Oral Surgery   Diagnostic Studies History Janeann Forehand, CNA; 05/11/2020 1:32 PM) Colonoscopy  never Mammogram  within last year Pap Smear  1-5 years ago  Allergies Janeann Forehand, CNA; 05/11/2020 1:33 PM) No Known Drug Allergies  [05/11/2020]: Allergies Reconciled   Medication History Janeann Forehand, CNA; 05/11/2020 1:33 PM) No Current Medications Medications Reconciled  Social History Janeann Forehand, CNA; 05/11/2020 1:32 PM) Alcohol use  Occasional alcohol use. Caffeine use  Coffee. No drug use  Tobacco use  Never smoker.  Family History Janeann Forehand, CNA; 05/11/2020 1:32 PM) Arthritis  Father, Mother. Depression  Daughter. Diabetes Mellitus  Father. Heart Disease  Father, Mother. Hypertension  Father.  Pregnancy / Birth History Janeann Forehand, CNA; 05/11/2020 1:32 PM) Age at menarche  50 years. Contraceptive History  Contraceptive implant. Gravida  2 Length (months) of breastfeeding  7-12 Maternal age  38-30 Para  2 Regular periods   Other Problems Janeann Forehand, CNA; 05/11/2020 1:32 PM) No pertinent past medical history     Review of Systems Janeann Forehand CNA; 05/11/2020 1:32 PM) General Not Present- Appetite Loss, Chills, Fatigue, Fever, Night  Sweats, Weight Gain and Weight Loss. Skin Not Present- Change in Wart/Mole, Dryness, Hives, Jaundice, New Lesions, Non-Healing Wounds, Rash and Ulcer. HEENT Present- Seasonal Allergies. Not Present- Earache, Hearing Loss, Hoarseness, Nose Bleed, Oral Ulcers, Ringing in the Ears, Sinus Pain, Sore Throat, Visual Disturbances, Wears glasses/contact lenses and Yellow Eyes. Respiratory Not Present- Bloody sputum, Chronic Cough, Difficulty Breathing, Snoring and Wheezing. Breast Present- Breast Mass. Not Present- Breast Pain, Nipple Discharge and Skin Changes. Cardiovascular Not Present- Chest Pain, Difficulty Breathing Lying Down, Leg Cramps, Palpitations, Rapid Heart Rate, Shortness of Breath and Swelling of Extremities. Gastrointestinal Present- Hemorrhoids. Not Present- Abdominal Pain, Bloating, Bloody Stool, Change in Bowel Habits, Chronic diarrhea, Constipation, Difficulty Swallowing, Excessive gas, Gets full quickly at meals, Indigestion, Nausea, Rectal Pain and Vomiting. Female Genitourinary Not Present- Frequency, Nocturia, Painful Urination, Pelvic Pain and Urgency. Musculoskeletal Not Present- Back Pain, Joint Pain, Joint Stiffness, Muscle Pain, Muscle Weakness and Swelling of Extremities. Neurological Not Present- Decreased Memory, Fainting, Headaches, Numbness, Seizures, Tingling, Tremor, Trouble walking and Weakness. Psychiatric Not Present- Anxiety, Bipolar, Change in Sleep Pattern, Depression, Fearful and Frequent crying. Endocrine Not Present- Cold Intolerance, Excessive Hunger, Hair Changes, Heat Intolerance, Hot flashes and New Diabetes. Hematology Not Present- Blood Thinners, Easy Bruising, Excessive bleeding, Gland problems, HIV and Persistent Infections.  Vitals Adriana Reams Alston CNA; 05/11/2020 1:33 PM) 05/11/2020 1:33 PM Weight: 135 lb Temp.: 98.68F  Pulse: 77 (Regular)  P.OX: 97% (Room air) BP: 118/80(Sitting, Left Arm, Standard)  Physical Exam Rolm Bookbinder MD;  05/11/2020 2:02 PM) General Mental Status-Alert. Orientation-Oriented X3. Breast Nipples-No Discharge. Breast Lump-No Palpable Breast Mass. Lymphatic Head & Neck General Head & Neck Lymphatics: Bilateral -  Description - Normal. Axillary General Axillary Region: Bilateral - Description - Normal. Note: no Henry adenopathy   Assessment & Plan Rolm Bookbinder MD; 05/11/2020 2:07 PM) PAPILLOMA OF LEFT BREAST (D24.2) Story: Left breast seed guided excisional biopsy We discussed options of observation with close interval follow up vs surgery. I did discuss with radiology and we both agreed on excision. the over one cm and appearance favor this and does not fit the criteria we developed to observe. I discussed seed guided excision with her, recovery and risks. will proceed

## 2020-06-01 NOTE — Transfer of Care (Signed)
Immediate Anesthesia Transfer of Care Note  Patient: Lori Santiago  Procedure(s) Performed: LEFT BREAST SEED GUIDED EXCISIONAL BIOPSY (Left Breast)  Patient Location: PACU  Anesthesia Type:General  Level of Consciousness: sedated  Airway & Oxygen Therapy: Patient Spontanous Breathing and Patient connected to face mask oxygen  Post-op Assessment: Report given to RN and Post -op Vital signs reviewed and stable  Post vital signs: Reviewed and stable  Last Vitals:  Vitals Value Taken Time  BP 89/55 06/01/20 1317  Temp    Pulse 80 06/01/20 1319  Resp 14 06/01/20 1319  SpO2 100 % 06/01/20 1319  Vitals shown include unvalidated device data.  Last Pain:  Vitals:   06/01/20 1116  TempSrc: Oral  PainSc: 0-No pain         Complications: No complications documented.

## 2020-06-01 NOTE — Anesthesia Preprocedure Evaluation (Signed)
Anesthesia Evaluation  Patient identified by MRN, date of birth, ID band Patient awake    Reviewed: Allergy & Precautions, NPO status , Patient's Chart, lab work & pertinent test results  Airway Mallampati: II  TM Distance: >3 FB Neck ROM: Full    Dental  (+) Teeth Intact   Pulmonary    breath sounds clear to auscultation       Cardiovascular  Rhythm:Regular Rate:Normal     Neuro/Psych    GI/Hepatic   Endo/Other    Renal/GU      Musculoskeletal   Abdominal   Peds  Hematology   Anesthesia Other Findings   Reproductive/Obstetrics                             Anesthesia Physical Anesthesia Plan  ASA: II  Anesthesia Plan: General   Post-op Pain Management:    Induction: Intravenous  PONV Risk Score and Plan: Ondansetron and Dexamethasone  Airway Management Planned: LMA  Additional Equipment:   Intra-op Plan:   Post-operative Plan:   Informed Consent: I have reviewed the patients History and Physical, chart, labs and discussed the procedure including the risks, benefits and alternatives for the proposed anesthesia with the patient or authorized representative who has indicated his/her understanding and acceptance.     Dental advisory given  Plan Discussed with: CRNA and Anesthesiologist  Anesthesia Plan Comments: (L. Breast papilloma H/o allergic rhinitis c/o  runny nose, no fevers, lungs clear, Covid (-) X 2 in last 72 hours  OK for GA with LMA  Roberts Gaudy )        Anesthesia Quick Evaluation

## 2020-06-01 NOTE — Op Note (Signed)
Preoperative diagnosis: Left breast mass with core biopsy consistent with papilloma Postoperative diagnosis: Same as above Procedure: Left breast radioactive seed guided excisional biopsy Surgeon: Dr. Serita Grammes Anesthesia: General with LMA Estimated blood loss: Minimal Specimens: Left breast tissue marked with paint containing seed and clip Complications: None Drains: None Sponge and needle count was correct at completion Disposition to recovery in stable condition  Indications:49 yof who has no family history who had a mass noted in left breast by her gyn. she then underwent imaging. density is b. she was noted to have two prior biopsy clips present. US showed a 1.2x0.5x0.9 cm mass with dilated duct present. there is no ax adenopathy. 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 she does not note this mass. she has no dc.  We discussed the options and due to the size we elected to excise this.  Procedure: She first underwent seed placement at the breast center.  I had these mammograms available in the operating room.  After informed consent was obtained she was then taken to the operating room.  SCDs were in place.  She was given antibiotics.  She was placed under anesthesia with an LMA.  She was prepped and draped in the standard sterile surgical fashion.  A surgical timeout was then performed.  I infiltrated Marcaine in the lateral areolar border.  I located the seed at the lateral areolar border.  I then made a periareolar incision in order to hide the scar later.  I then dissected down to the seed.  Using neoprobe guidance I remove the seed and some of the surrounding tissue.  I marked this with pain.  I then did a mammogram in the operating room which confirmed removal of the clip and the seed.  I then obtained hemostasis.  I closed the breast tissue with 2-0 Vicryl.  The skin was closed with 3-0 Vicryl and 5-0 Monocryl.  Glue  and Steri-Strips were applied.  She tolerated this well was extubated and transferred to recovery stable.

## 2020-06-01 NOTE — Discharge Instructions (Signed)
[facility] Office Phone Number [phone]  BREAST BIOPSY/ PARTIAL MASTECTOMY: POST OP INSTRUCTIONS Take 400 mg of ibuprofen every 8 hours or 650 mg tylenol every 6 hours for next 72 hours then as needed. Use ice several times daily also. Always review your discharge instruction sheet given to you by the facility where your surgery was performed.  IF YOU HAVE DISABILITY OR FAMILY LEAVE FORMS, YOU MUST BRING THEM TO THE OFFICE FOR PROCESSING.  DO NOT GIVE THEM TO YOUR DOCTOR.  1. A prescription for pain medication may be given to you upon discharge.  Take your pain medication as prescribed, if needed.  If narcotic pain medicine is not needed, then you may take acetaminophen (Tylenol), naprosyn (Alleve) or ibuprofen (Advil) as needed. 2. Take your usually prescribed medications unless otherwise directed 3. If you need a refill on your pain medication, please contact your pharmacy.  They will contact our office to request authorization.  Prescriptions will not be filled after 5pm or on week-ends. 4. You should eat very light the first 24 hours after surgery, such as soup, crackers, pudding, etc.  Resume your normal diet the day after surgery. 5. Most patients will experience some swelling and bruising in the breast.  Ice packs and a good support bra will help.  Wear the breast binder provided or a sports bra for 72 hours day and night.  After that wear a sports bra during the day until you return to the office. Swelling and bruising can take several days to resolve.  6. It is common to experience some constipation if taking pain medication after surgery.  Increasing fluid intake and taking a stool softener will usually help or prevent this problem from occurring.  A mild laxative (Milk of Magnesia or Miralax) should be taken according to package directions if there are no bowel movements after 48 hours. 7. Unless discharge instructions indicate otherwise, you may remove your bandages 48  hours after surgery and you may shower at that time.  You may have steri-strips (small skin tapes) in place directly over the incision.  These strips should be left on the skin for 7-10 days and will come off on their own.  If your surgeon used skin glue on the incision, you may shower in 24 hours.  The glue will flake off over the next 2-3 weeks.  Any sutures or staples will be removed at the office during your follow-up visit. 8. ACTIVITIES:  You may resume regular daily activities (gradually increasing) beginning the next day.  Wearing a good support bra or sports bra minimizes pain and swelling.  You may have sexual intercourse when it is comfortable. a. You may drive when you no longer are taking prescription pain medication, you can comfortably wear a seatbelt, and you can safely maneuver your car and apply brakes. b. RETURN TO WORK:  ______________________________________________________________________________________ 9. You should see your doctor in the office for a follow-up appointment approximately two weeks after your surgery.  Your doctor's nurse will typically make your follow-up appointment when she calls you with your pathology report.  Expect your pathology report 3-4 business days after your surgery.  You may call to check if you do not hear from us after three days. 10. OTHER INSTRUCTIONS: _______________________________________________________________________________________________ _____________________________________________________________________________________________________________________________________ _____________________________________________________________________________________________________________________________________ _____________________________________________________________________________________________________________________________________  WHEN TO CALL DR WAKEFIELD: 1. Fever over 101.0 2. Nausea and/or vomiting. 3. Extreme swelling or  bruising. 4. Continued bleeding from incision. 5. Increased pain, redness, or drainage from the incision.  The clinic   staff is available to answer your questions during regular business hours.  Please don't hesitate to call and ask to speak to one of the nurses for clinical concerns.  If you have a medical emergency, go to the nearest emergency room or call 911.  A surgeon from [facility] is always on call at the hospital.  For further questions, please visit [email or web address]   Post Anesthesia Home Care Instructions  Activity: Get plenty of rest for the remainder of the day. A responsible individual must stay with you for 24 hours following the procedure.  For the next 24 hours, DO NOT: -Drive a car -Paediatric nurse -Drink alcoholic beverages -Take any medication unless instructed by your physician -Make any legal decisions or sign important papers.  Meals: Start with liquid foods such as gelatin or soup. Progress to regular foods as tolerated. Avoid greasy, spicy, heavy foods. If nausea and/or vomiting occur, drink only clear liquids until the nausea and/or vomiting subsides. Call your physician if vomiting continues.  Special Instructions/Symptoms: Your throat may feel dry or sore from the anesthesia or the breathing tube placed in your throat during surgery. If this causes discomfort, gargle with warm salt water. The discomfort should disappear within 24 hours.  If you had a scopolamine patch placed behind your ear for the management of post- operative nausea and/or vomiting:  1. The medication in the patch is effective for 72 hours, after which it should be removed.  Wrap patch in a tissue and discard in the trash. Wash hands thoroughly with soap and water. 2. You may remove the patch earlier than 72 hours if you experience unpleasant side effects which may include dry mouth, dizziness or visual disturbances. 3. Avoid touching the patch. Wash your hands  with soap and water after contact with the patch.    No Tylenol or Ibuprofen until 5:20PM.

## 2020-06-02 ENCOUNTER — Encounter (HOSPITAL_BASED_OUTPATIENT_CLINIC_OR_DEPARTMENT_OTHER): Payer: Self-pay | Admitting: General Surgery

## 2020-06-02 NOTE — Anesthesia Postprocedure Evaluation (Signed)
Anesthesia Post Note  Patient: Lori Santiago  Procedure(s) Performed: LEFT BREAST SEED GUIDED EXCISIONAL BIOPSY (Left Breast)     Patient location during evaluation: PACU Anesthesia Type: General Level of consciousness: awake and alert Pain management: pain level controlled Vital Signs Assessment: post-procedure vital signs reviewed and stable Respiratory status: spontaneous breathing, nonlabored ventilation and respiratory function stable Cardiovascular status: blood pressure returned to baseline and stable Postop Assessment: no apparent nausea or vomiting Anesthetic complications: no   No complications documented.  Last Vitals:  Vitals:   06/01/20 1330 06/01/20 1352  BP: 103/66 110/89  Pulse: 98 86  Resp: (!) 25 16  Temp:  36.6 C  SpO2: 100% 100%    Last Pain:  Vitals:   06/01/20 1352  TempSrc:   PainSc: 0-No pain                 Merlinda Frederick

## 2020-06-05 LAB — SURGICAL PATHOLOGY

## 2020-06-26 DIAGNOSIS — D225 Melanocytic nevi of trunk: Secondary | ICD-10-CM | POA: Diagnosis not present

## 2020-06-26 DIAGNOSIS — D2261 Melanocytic nevi of right upper limb, including shoulder: Secondary | ICD-10-CM | POA: Diagnosis not present

## 2020-06-26 DIAGNOSIS — D2272 Melanocytic nevi of left lower limb, including hip: Secondary | ICD-10-CM | POA: Diagnosis not present

## 2020-06-26 DIAGNOSIS — D2262 Melanocytic nevi of left upper limb, including shoulder: Secondary | ICD-10-CM | POA: Diagnosis not present

## 2020-12-07 DIAGNOSIS — Z79899 Other long term (current) drug therapy: Secondary | ICD-10-CM | POA: Diagnosis not present

## 2020-12-07 DIAGNOSIS — Z0000 Encounter for general adult medical examination without abnormal findings: Secondary | ICD-10-CM | POA: Diagnosis not present

## 2020-12-14 ENCOUNTER — Other Ambulatory Visit: Payer: Self-pay | Admitting: Internal Medicine

## 2020-12-14 DIAGNOSIS — Z0000 Encounter for general adult medical examination without abnormal findings: Secondary | ICD-10-CM | POA: Diagnosis not present

## 2020-12-14 DIAGNOSIS — Z8249 Family history of ischemic heart disease and other diseases of the circulatory system: Secondary | ICD-10-CM

## 2020-12-14 DIAGNOSIS — D242 Benign neoplasm of left breast: Secondary | ICD-10-CM | POA: Diagnosis not present

## 2020-12-14 DIAGNOSIS — Z23 Encounter for immunization: Secondary | ICD-10-CM | POA: Diagnosis not present

## 2021-01-02 ENCOUNTER — Ambulatory Visit
Admission: RE | Admit: 2021-01-02 | Discharge: 2021-01-02 | Disposition: A | Discharge status: Home / Self Care | Payer: No Typology Code available for payment source | Source: Ambulatory Visit | Attending: Internal Medicine | Admitting: Internal Medicine

## 2021-01-02 DIAGNOSIS — Z8249 Family history of ischemic heart disease and other diseases of the circulatory system: Secondary | ICD-10-CM

## 2021-01-26 DIAGNOSIS — H02886 Meibomian gland dysfunction of left eye, unspecified eyelid: Secondary | ICD-10-CM | POA: Diagnosis not present

## 2021-01-26 DIAGNOSIS — H16223 Keratoconjunctivitis sicca, not specified as Sjogren's, bilateral: Secondary | ICD-10-CM | POA: Diagnosis not present

## 2021-01-26 DIAGNOSIS — H02883 Meibomian gland dysfunction of right eye, unspecified eyelid: Secondary | ICD-10-CM | POA: Diagnosis not present

## 2021-06-26 ENCOUNTER — Other Ambulatory Visit: Payer: Self-pay | Admitting: Obstetrics & Gynecology

## 2021-06-26 DIAGNOSIS — D225 Melanocytic nevi of trunk: Secondary | ICD-10-CM | POA: Diagnosis not present

## 2021-06-26 DIAGNOSIS — D2261 Melanocytic nevi of right upper limb, including shoulder: Secondary | ICD-10-CM | POA: Diagnosis not present

## 2021-06-26 DIAGNOSIS — D2262 Melanocytic nevi of left upper limb, including shoulder: Secondary | ICD-10-CM | POA: Diagnosis not present

## 2021-06-26 DIAGNOSIS — L720 Epidermal cyst: Secondary | ICD-10-CM | POA: Diagnosis not present

## 2021-06-26 DIAGNOSIS — Z1231 Encounter for screening mammogram for malignant neoplasm of breast: Secondary | ICD-10-CM

## 2021-07-03 ENCOUNTER — Ambulatory Visit: Payer: No Typology Code available for payment source

## 2021-07-10 DIAGNOSIS — E559 Vitamin D deficiency, unspecified: Secondary | ICD-10-CM | POA: Diagnosis not present

## 2021-07-10 DIAGNOSIS — L659 Nonscarring hair loss, unspecified: Secondary | ICD-10-CM | POA: Diagnosis not present

## 2021-07-10 DIAGNOSIS — N959 Unspecified menopausal and perimenopausal disorder: Secondary | ICD-10-CM | POA: Diagnosis not present

## 2021-07-10 DIAGNOSIS — R5383 Other fatigue: Secondary | ICD-10-CM | POA: Diagnosis not present

## 2021-07-12 ENCOUNTER — Ambulatory Visit
Admission: RE | Admit: 2021-07-12 | Discharge: 2021-07-12 | Disposition: A | Discharge status: Home / Self Care | Payer: BC Managed Care – PPO | Source: Ambulatory Visit | Attending: Obstetrics & Gynecology | Admitting: Obstetrics & Gynecology

## 2021-07-12 DIAGNOSIS — Z1231 Encounter for screening mammogram for malignant neoplasm of breast: Secondary | ICD-10-CM

## 2021-07-17 DIAGNOSIS — H02886 Meibomian gland dysfunction of left eye, unspecified eyelid: Secondary | ICD-10-CM | POA: Diagnosis not present

## 2021-07-17 DIAGNOSIS — H16223 Keratoconjunctivitis sicca, not specified as Sjogren's, bilateral: Secondary | ICD-10-CM | POA: Diagnosis not present

## 2021-08-21 ENCOUNTER — Encounter (HOSPITAL_COMMUNITY): Payer: Self-pay

## 2021-09-06 ENCOUNTER — Ambulatory Visit (INDEPENDENT_AMBULATORY_CARE_PROVIDER_SITE_OTHER): Payer: BC Managed Care – PPO | Admitting: Obstetrics & Gynecology

## 2021-09-06 ENCOUNTER — Encounter: Payer: Self-pay | Admitting: Obstetrics & Gynecology

## 2021-09-06 VITALS — BP 116/78 | HR 86 | Ht 63.25 in | Wt 141.0 lb

## 2021-09-06 DIAGNOSIS — Z01419 Encounter for gynecological examination (general) (routine) without abnormal findings: Secondary | ICD-10-CM | POA: Diagnosis not present

## 2021-09-06 DIAGNOSIS — Z30431 Encounter for routine checking of intrauterine contraceptive device: Secondary | ICD-10-CM

## 2021-09-06 DIAGNOSIS — Z9189 Other specified personal risk factors, not elsewhere classified: Secondary | ICD-10-CM

## 2021-09-06 NOTE — Progress Notes (Signed)
Lori Santiago 1970-11-03 [id number]   History:    51 y.o.   G2P2L2 Married.  Vasectomy.  Production assistant, radio.  Daughters 41 yo rising Museum/gallery exhibitions officer at [employer] [place] and 51 yo rising junior at [employer] .   RP:  Established patient presenting for annual gyn exam    HPI: Well on Mirena IUD inserted 04/18/2017.  No abnormal bleeding, no pelvic pain.  No pain with intercourse.  Pap Neg 02/2019.  No h/o abnormal Pap.  Will repeat Pap at 3 years. Urine and bowel movements normal.  Breasts normal.  Mammo Neg 07/2021.  Body mass index at 24.78.  Fit and healthy nutrition.  Scheduled for [facility].  Health labs with family physician.   Past medical history,surgical history, family history and social history were all reviewed and documented in the EPIC chart.  Gynecologic History No LMP recorded. (Menstrual status: IUD).  Obstetric History OB History  Gravida Para Term Preterm AB Living  '2 2 2     2  '$ SAB IAB Ectopic Multiple Live Births               # Outcome Date GA Lbr Len/2nd Weight Sex Delivery Anes PTL Lv  2 Term           1 Term              ROS: A ROS was performed and pertinent positives and negatives are included in the history.  GENERAL: No fevers or chills. HEENT: No change in vision, no earache, sore throat or sinus congestion. NECK: No pain or stiffness. CARDIOVASCULAR: No chest pain or pressure. No palpitations. PULMONARY: No shortness of breath, cough or wheeze. GASTROINTESTINAL: No abdominal pain, nausea, vomiting or diarrhea, melena or bright red blood per rectum. GENITOURINARY: No urinary frequency, urgency, hesitancy or dysuria. MUSCULOSKELETAL: No joint or muscle pain, no back pain, no recent trauma. DERMATOLOGIC: No rash, no itching, no lesions. ENDOCRINE: No polyuria, polydipsia, no heat or cold intolerance. No recent change in weight. HEMATOLOGICAL: No anemia or easy bruising or bleeding. NEUROLOGIC: No headache, seizures, numbness, tingling or weakness. PSYCHIATRIC: No  depression, no loss of interest in normal activity or change in sleep pattern.     Exam:   BP 116/78   Pulse 86   Ht 5' 3.25" (1.607 m)   Wt 141 lb (64 kg)   SpO2 98%   BMI 24.78 kg/m   Body mass index is 24.78 kg/m.  General appearance : Well developed well nourished female. No acute distress HEENT: Eyes: no retinal hemorrhage or exudates,  Neck supple, trachea midline, no carotid bruits, no thyroidmegaly Lungs: Clear to auscultation, no rhonchi or wheezes, or rib retractions  Heart: Regular rate and rhythm, no murmurs or gallops Breast:Examined in sitting and supine position were symmetrical in appearance, no palpable masses or tenderness,  no skin retraction, no nipple inversion, no nipple discharge, no skin discoloration, no axillary or supraclavicular lymphadenopathy Abdomen: no palpable masses or tenderness, no rebound or guarding Extremities: no edema or skin discoloration or tenderness  Pelvic: Vulva: Normal             Vagina: No gross lesions or discharge  Cervix: No gross lesions or discharge.  [facility]UD strings [facility] at [facility].  Uterus  RV, normal size, shape and consistency, non-tender and mobile  Adnexa  Without masses or tenderness  Anus: Normal   Assessment/Plan:  51 y.o. female for annual exam   1. Well female exam with routine gynecological exam Well on  Mirena IUD inserted 04/18/2017.  No abnormal bleeding, no pelvic pain.  No pain with intercourse.  Pap Neg 02/2019.  No h/o abnormal Pap.  Will repeat Pap at 3 years. Urine and bowel movements normal.  Breasts normal.  Mammo Neg 07/2021.  Body mass index at 24.78.  Fit and healthy nutrition.  Scheduled for [facility].  Health labs with family physician.  2. Relies on partner vasectomy for contraception  3. Encounter for routine checking of intrauterine contraceptive device (IUD) Well on Mirena IUD inserted 04/18/2017 for cycle control.  No abnormal bleeding, no pelvic pain.  No pain with intercourse.  Mirena IUD in good   Other  orders - progesterone (PROMETRIUM) 100 MG capsule; SMARTSIG:1 Capsule(s) By Mouth Every Evening - rosuvastatin (CRESTOR) 10 MG tablet; Take 10 mg by mouth daily. - TYRVAYA 0.03 MG/ACT SOLN; Place 1 spray into both nostrils 2 (two) times daily.   Princess Bruins MD, 2:47 PM 09/06/2021

## 2021-10-02 DIAGNOSIS — H0288B Meibomian gland dysfunction left eye, upper and lower eyelids: Secondary | ICD-10-CM | POA: Diagnosis not present

## 2021-10-02 DIAGNOSIS — H0288A Meibomian gland dysfunction right eye, upper and lower eyelids: Secondary | ICD-10-CM | POA: Diagnosis not present

## 2021-10-02 DIAGNOSIS — H16223 Keratoconjunctivitis sicca, not specified as Sjogren's, bilateral: Secondary | ICD-10-CM | POA: Diagnosis not present

## 2021-10-04 ENCOUNTER — Telehealth: Payer: Self-pay | Admitting: *Deleted

## 2021-10-04 NOTE — Chronic Care Management (AMB) (Signed)
  Care Coordination   Note   10/04/2021 Name: Lori Santiago MRN: [id number] DOB: [birth year]  Lori Santiago is a 51 y.o. year old female who sees Shon Baton, MD for primary care. I reached out to Carita Pian by phone today to offer care coordination services.  [name] was given information about Care Coordination services today including:   The Care Coordination services include support from the care team which includes your Nurse Coordinator, Clinical Social Worker, or Pharmacist.  The Care Coordination team is here to help remove barriers to the health concerns and goals most important to you. Care Coordination services are voluntary, and the patient may decline or stop services at any time by request to their care team member.   Care Coordination Consent Status: Patient agreed to services and verbal consent obtained.   Follow up plan:  Telephone appointment with care coordination team member scheduled for:  10/08/21  Encounter Outcome:  Pt. Scheduled  [place]  Direct Dial: [phone]

## 2021-10-08 ENCOUNTER — Ambulatory Visit: Payer: Self-pay

## 2021-10-08 NOTE — Patient Instructions (Signed)
Visit Information  Thank you for taking time to visit with me today. Please don't hesitate to contact me if I can be of assistance to you.   Following are the goals we discussed today:   Goals Addressed             This Visit's Progress    COMPLETED: Care Coordination Activities       Care Coordination Interventions: [facility] screening performed - no acute resource needs identified at this time Determined the patient does not have concerns with medication costs and/or adherence Performed chart review to note recent referral placed to [facility] for a colonoscopy. Appointment not yet scheduled but patient plans to call  Reviewed role of care coordination team - no follow up desired at this time Instructed the patient to contact her primary care provider as needed        Please call the care guide team at [phone] if you need to schedule an appointment with our care coordination team.  If you are experiencing a Mental Health or [place] or need someone to talk to, please call [phone]-TALK (toll free, 24 hour hotline)  Patient verbalizes understanding of instructions and care plan provided today and agrees to view in [place]. Active MyChart status and patient understanding of how to access instructions and care plan via MyChart confirmed with patient.     No further follow up required: Please contact your primary care provider as needed.  Daneen Schick, BSW, CDP Social Worker, Certified Dementia Practitioner Care Coordination [phone]

## 2021-10-08 NOTE — Patient Outreach (Signed)
  Care Coordination   Initial Visit Note   10/08/2021 Name: Anwitha Mapes MRN: [id number] DOB: 09/20/1970  Conleigh Heinlein is a 51 y.o. year old female who sees Shon Baton, MD for primary care. I spoke with  Carita Pian by phone today.  What matters to the patients health and wellness today?  I need to schedule a colonoscopy    Goals Addressed             This Visit's Progress    COMPLETED: Care Coordination Activities       Care Coordination Interventions: SDoH screening performed - no acute resource needs identified at this time Determined the patient does not have concerns with medication costs and/or adherence Performed chart review to note recent referral placed to [facility] for a colonoscopy. Appointment not yet scheduled but patient plans to call  Reviewed role of care coordination team - no follow up desired at this time Instructed the patient to contact her primary care provider as needed        SDOH assessments and interventions completed:  Yes  SDOH Interventions Today    Flowsheet Row Most Recent Value  SDOH Interventions   Food Insecurity Interventions Intervention Not Indicated  Housing Interventions Intervention Not Indicated  Transportation Interventions Intervention Not Indicated        Care Coordination Interventions Activated:  Yes  Care Coordination Interventions:  Yes, provided   Follow up plan: No further intervention required.   Encounter Outcome:  Pt. Visit Completed   Daneen Schick, BSW, CDP Social Worker, Certified Dementia Practitioner Care Coordination [phone]

## 2021-10-19 ENCOUNTER — Encounter: Payer: Self-pay | Admitting: Internal Medicine

## 2021-11-08 ENCOUNTER — Ambulatory Visit (AMBULATORY_SURGERY_CENTER): Payer: Self-pay

## 2021-11-08 VITALS — Ht 63.0 in | Wt 145.0 lb

## 2021-11-08 DIAGNOSIS — Z1211 Encounter for screening for malignant neoplasm of colon: Secondary | ICD-10-CM

## 2021-11-08 MED ORDER — NA SULFATE-K SULFATE-MG SULF 17.5-3.13-1.6 GM/177ML PO SOLN: 1.0000 | Freq: Once | ORAL | 0 refills | Status: AC

## 2021-11-22 ENCOUNTER — Ambulatory Visit (AMBULATORY_SURGERY_CENTER): Payer: BC Managed Care – PPO | Admitting: Internal Medicine

## 2021-11-22 ENCOUNTER — Encounter: Payer: Self-pay | Admitting: Internal Medicine

## 2021-11-22 VITALS — BP 100/68 | HR 78 | Temp 98.2°F | Resp 17 | Ht 63.25 in | Wt 145.0 lb

## 2021-11-22 DIAGNOSIS — Z1211 Encounter for screening for malignant neoplasm of colon: Secondary | ICD-10-CM | POA: Diagnosis not present

## 2021-11-22 MED ORDER — SODIUM CHLORIDE 0.9 % IV SOLN: 500.0000 mL | INTRAVENOUS | Status: DC

## 2021-11-22 NOTE — Progress Notes (Signed)
GASTROENTEROLOGY PROCEDURE H&P NOTE   Primary Care Physician: Shon Baton, MD    Reason for Procedure:   Colon cancer screening  Plan:    Colonoscopy  Patient is appropriate for endoscopic procedure(s) in the ambulatory ([place]) setting.  The nature of the procedure, as well as the risks, benefits, and alternatives were carefully and thoroughly reviewed with the patient. Ample time for discussion and questions allowed. The patient understood, was satisfied, and agreed to proceed.     HPI: Lori Santiago is a 51 y.o. female who presents for colonoscopy for colon cancer screening. Denies blood in stools, changes in bowel habits, or weight loss. Grandmother had colon cancer in her 59s.  Past Medical History:  Diagnosis Date   Allergy    Medical history non-contributory     Past Surgical History:  Procedure Laterality Date   BREAST EXCISIONAL BIOPSY     HERNIA REPAIR     1975 and 1977   INTRAUTERINE DEVICE (IUD) INSERTION     mirena inserted 04-18-17   RADIOACTIVE SEED GUIDED EXCISIONAL BREAST BIOPSY Left 06/01/2020   Procedure: LEFT BREAST SEED GUIDED EXCISIONAL BIOPSY;  Surgeon: Rolm Bookbinder, MD;  Location: [facility];  Service: General;  Laterality: Left;    Prior to Admission medications   Medication Sig Start Date End Date Taking? Authorizing Provider  cholecalciferol (VITAMIN D3) 25 MCG (1000 UT) tablet Take 1,000 Units by mouth daily.    [provider]  loratadine (CLARITIN) 10 MG tablet Take 10 mg by mouth daily. Patient not taking: Reported on 11/08/2021    [provider]  Multiple Vitamin (MULTIVITAMIN) tablet Take 1 tablet by mouth daily.    [provider]  progesterone (PROMETRIUM) 100 MG capsule SMARTSIG:1 Capsule(s) By Mouth Every Evening 06/20/21   [provider]  rosuvastatin (CRESTOR) 10 MG tablet Take 10 mg by mouth daily. 06/27/21   [provider]  TYRVAYA 0.03 MG/ACT SOLN Place 1 spray into  both nostrils 2 (two) times daily. 08/16/21   [provider]  Shirley Friar 5 % SOLN  11/03/21   [provider]    Current Outpatient Medications  Medication Sig Dispense Refill   cholecalciferol (VITAMIN D3) 25 MCG (1000 UT) tablet Take 1,000 Units by mouth daily.     loratadine (CLARITIN) 10 MG tablet Take 10 mg by mouth daily. (Patient not taking: Reported on 11/08/2021)     Multiple Vitamin (MULTIVITAMIN) tablet Take 1 tablet by mouth daily.     progesterone (PROMETRIUM) 100 MG capsule SMARTSIG:1 Capsule(s) By Mouth Every Evening     rosuvastatin (CRESTOR) 10 MG tablet Take 10 mg by mouth daily.     TYRVAYA 0.03 MG/ACT SOLN Place 1 spray into both nostrils 2 (two) times daily.     XIIDRA 5 % SOLN      Current Facility-Administered Medications  Medication Dose Route Frequency Provider Last Rate Last Admin   0.9 %  sodium chloride infusion  500 mL Intravenous Continuous Sharyn Creamer, MD        Allergies as of 11/22/2021   (No Known Allergies)    Family History  Problem Relation Age of Onset   Hypertension Mother    Diabetes Father    Hypertension Father    Colon cancer Maternal Grandmother    Cancer Maternal Grandmother        colon   Cancer Paternal Grandmother        uterine   Breast cancer Neg Hx    Colon  polyps Neg Hx    Esophageal cancer Neg Hx    Rectal cancer Neg Hx    Stomach cancer Neg Hx     Social History   Socioeconomic History   Marital status: Married    Spouse name: Not on file   Number of children: Not on file   Years of education: Not on file   Highest education level: Not on file  Occupational History   Not on file  Tobacco Use   Smoking status: Never   Smokeless tobacco: Never  Vaping Use   Vaping Use: Never used  Substance and Sexual Activity   Alcohol use: Yes    Comment: 2 DRINKS A WEEK    Drug use: No   Sexual activity: Yes    Partners: Male    Birth control/protection: I.U.D.    Comment: 1st intercourse- 16, partners-  more than 5  Other Topics Concern   Not on file  Social History Narrative   Not on file   Social Determinants of Health   Financial Resource Strain: Not on file  Food Insecurity: No Food Insecurity (10/08/2021)   Hunger Vital Sign    Worried About Running Out of Food in the Last Year: Never true    Ran Out of Food in the Last Year: Never true  Transportation Needs: No Transportation Needs (10/08/2021)   PRAPARE - Hydrologist (Medical): No    Lack of Transportation (Non-Medical): No  Physical Activity: Not on file  Stress: Not on file  Social Connections: Not on file  Intimate Partner Violence: Not on file    Physical Exam: Vital signs in last 24 hours: BP (!) 87/55   Pulse 74   Temp 98.2 F (36.8 C)   Ht 5' 3.25" (1.607 m)   Wt 145 lb (65.8 kg)   SpO2 98%   BMI 25.48 kg/m  GEN: NAD EYE: Sclerae anicteric ENT: MMM CV: Non-tachycardic Pulm: No increased work of breathing GI: Soft, NT/ND NEURO:  Alert & Oriented   [name], MD [facility]  11/22/2021 2:03 PM

## 2021-11-22 NOTE — Progress Notes (Signed)
A/ox3, pleased with MAC, report to RN 

## 2021-11-22 NOTE — Patient Instructions (Signed)
Resume previous diet and medications. Repeat Colonoscopy in 10 years for surveillance.  YOU HAD AN ENDOSCOPIC PROCEDURE TODAY AT THE [facility]:   Refer to the procedure report that was given to you for any specific questions about what was found during the examination.  If the procedure report does not answer your questions, please call your gastroenterologist to clarify.  If you requested that your care partner not be given the details of your procedure findings, then the procedure report has been included in a sealed envelope for you to review at your convenience later.  YOU SHOULD EXPECT: Some feelings of bloating in the abdomen. Passage of more gas than usual.  Walking can help get rid of the air that was put into your GI tract during the procedure and reduce the bloating. If you had a lower endoscopy (such as a colonoscopy or flexible sigmoidoscopy) you may notice spotting of blood in your stool or on the toilet paper. If you underwent a bowel prep for your procedure, you may not have a normal bowel movement for a few days.  Please Note:  You might notice some irritation and congestion in your nose or some drainage.  This is from the oxygen used during your procedure.  There is no need for concern and it should clear up in a day or so.  SYMPTOMS TO REPORT IMMEDIATELY:  Following lower endoscopy (colonoscopy or flexible sigmoidoscopy):  Excessive amounts of blood in the stool  Significant tenderness or worsening of abdominal pains  Swelling of the abdomen that is new, acute  Fever of 100F or higher   For urgent or emergent issues, a gastroenterologist can be reached at any hour by calling [phone]. Do not use MyChart messaging for urgent concerns.    DIET:  We do recommend a small meal at first, but then you may proceed to your regular diet.  Drink plenty of fluids but you should avoid alcoholic beverages for 24 hours.  ACTIVITY:  You should plan to take it easy for  the rest of today and you should NOT DRIVE or use heavy machinery until tomorrow (because of the sedation medicines used during the test).    FOLLOW UP: Our staff will call the number listed on your records the next business day following your procedure.  We will call around 7:15- 8:00 am to check on you and address any questions or concerns that you may have regarding the information given to you following your procedure. If we do not reach you, we will leave a message.     If any biopsies were taken you will be contacted by phone or by letter within the next 1-3 weeks.  Please call us at [phone] if you have not heard about the biopsies in 3 weeks.    SIGNATURES/CONFIDENTIALITY: You and/or your care partner have signed paperwork which will be entered into your electronic medical record.  These signatures attest to the fact that that the information above on your After Visit Summary has been reviewed and is understood.  Full responsibility of the confidentiality of this discharge information lies with you and/or your care-partner. 

## 2021-11-22 NOTE — Op Note (Signed)
[facility] Patient Name: Lori Santiago Procedure Date: 11/22/2021 2:05 PM MRN: [id number] Endoscopist: Sonny Masters "Lori Santiago ,  Age: 51 Referring MD:  Date of Birth: 09/05/1970 Gender: Female Account #: [id number] Procedure:                Colonoscopy Indications:              Screening for colorectal malignant neoplasm, This                            is the patient's first colonoscopy Medicines:                Monitored Anesthesia Care Procedure:                Pre-Anesthesia Assessment:                           - Prior to the procedure, a History and Physical                            was performed, and patient medications and                            allergies were reviewed. The patient's tolerance of                            previous anesthesia was also reviewed. The risks                            and benefits of the procedure and the sedation                            options and risks were discussed with the patient.                            All questions were answered, and informed consent                            was obtained. Prior Anticoagulants: The patient has                            taken no previous anticoagulant or antiplatelet                            agents. ASA Grade Assessment: I - A normal, healthy                            patient. After reviewing the risks and benefits,                            the patient was deemed in satisfactory condition to                            undergo the procedure.  After obtaining informed consent, the colonoscope                            was passed under direct vision. Throughout the                            procedure, the patient's blood pressure, pulse, and                            oxygen saturations were monitored continuously. The                            Olympus Scope [id number] was introduced through the                            anus and advanced to the the  terminal ileum. The                            colonoscopy was performed without difficulty. The                            patient tolerated the procedure well. The quality                            of the bowel preparation was excellent. The                            terminal ileum, ileocecal valve, appendiceal                            orifice, and rectum were photographed. Scope In: 2:30:32 PM Scope Out: 2:49:58 PM Scope Withdrawal Time: 0 hours 10 minutes 40 seconds  Total Procedure Duration: 0 hours 19 minutes 26 seconds  Findings:                 The terminal ileum appeared normal.                           Non-bleeding internal hemorrhoids were found during                            retroflexion.                           The exam was otherwise without abnormality. Complications:            No immediate complications. Estimated Blood Loss:     Estimated blood loss: none. Impression:               - The examined portion of the ileum was normal.                           - Non-bleeding internal hemorrhoids.                           - The examination was otherwise normal.                           -  No specimens collected. Recommendation:           - Discharge patient to home (with escort).                           - Repeat colonoscopy in 10 years for screening                            purposes.                           - The findings and recommendations were discussed                            with the patient. Dr Georgian Co "Lori Santiago" [name],  11/22/2021 2:53:46 PM

## 2021-11-23 ENCOUNTER — Telehealth: Payer: Self-pay | Admitting: *Deleted

## 2021-11-23 NOTE — Telephone Encounter (Signed)
  Follow up Call-     11/22/2021    2:02 PM  Call back number  Post procedure Call Back phone  # [phone]  Permission to leave phone message Yes     Patient questions:  Do you have a fever, pain , or abdominal swelling? No. Pain Score  0 *  Have you tolerated food without any problems? No.  Have you been able to return to your normal activities? No.  Do you have any questions about your discharge instructions: Diet   Yes.   Medications  Yes.   Follow up visit  Yes.    Do you have questions or concerns about your Care? No.  Actions: * If pain score is 4 or above: No action needed, pain <4.

## 2021-11-29 DIAGNOSIS — N959 Unspecified menopausal and perimenopausal disorder: Secondary | ICD-10-CM | POA: Diagnosis not present

## 2022-01-08 DIAGNOSIS — Z713 Dietary counseling and surveillance: Secondary | ICD-10-CM | POA: Diagnosis not present

## 2022-01-17 DIAGNOSIS — Z713 Dietary counseling and surveillance: Secondary | ICD-10-CM | POA: Diagnosis not present

## 2022-01-17 DIAGNOSIS — E785 Hyperlipidemia, unspecified: Secondary | ICD-10-CM | POA: Diagnosis not present

## 2022-01-18 DIAGNOSIS — E785 Hyperlipidemia, unspecified: Secondary | ICD-10-CM | POA: Diagnosis not present

## 2022-01-22 DIAGNOSIS — Z713 Dietary counseling and surveillance: Secondary | ICD-10-CM | POA: Diagnosis not present

## 2022-01-22 DIAGNOSIS — E785 Hyperlipidemia, unspecified: Secondary | ICD-10-CM | POA: Diagnosis not present

## 2022-01-25 DIAGNOSIS — R82998 Other abnormal findings in urine: Secondary | ICD-10-CM | POA: Diagnosis not present

## 2022-01-25 DIAGNOSIS — Z1331 Encounter for screening for depression: Secondary | ICD-10-CM | POA: Diagnosis not present

## 2022-01-25 DIAGNOSIS — Z0000 Encounter for general adult medical examination without abnormal findings: Secondary | ICD-10-CM | POA: Diagnosis not present

## 2022-01-25 DIAGNOSIS — I2510 Atherosclerotic heart disease of native coronary artery without angina pectoris: Secondary | ICD-10-CM | POA: Diagnosis not present

## 2022-02-19 DIAGNOSIS — Z713 Dietary counseling and surveillance: Secondary | ICD-10-CM | POA: Diagnosis not present

## 2022-04-25 DIAGNOSIS — H16223 Keratoconjunctivitis sicca, not specified as Sjogren's, bilateral: Secondary | ICD-10-CM | POA: Diagnosis not present

## 2022-05-16 IMAGING — MG MM PLC BREAST LOC DEV 1ST LESION INC MAMMO GUIDE*L*
8 series · 8 of 8 positions shown · non-contrast
Comparison: Previous exam(s).

CLINICAL DATA: Patient with a LEFT breast papilloma scheduled for
surgical excision requiring preoperative radioactive seed
localization.

EXAM:
MAMMOGRAPHIC GUIDED RADIOACTIVE SEED LOCALIZATION OF THE LEFT BREAST

[L LM (1 of 2)]
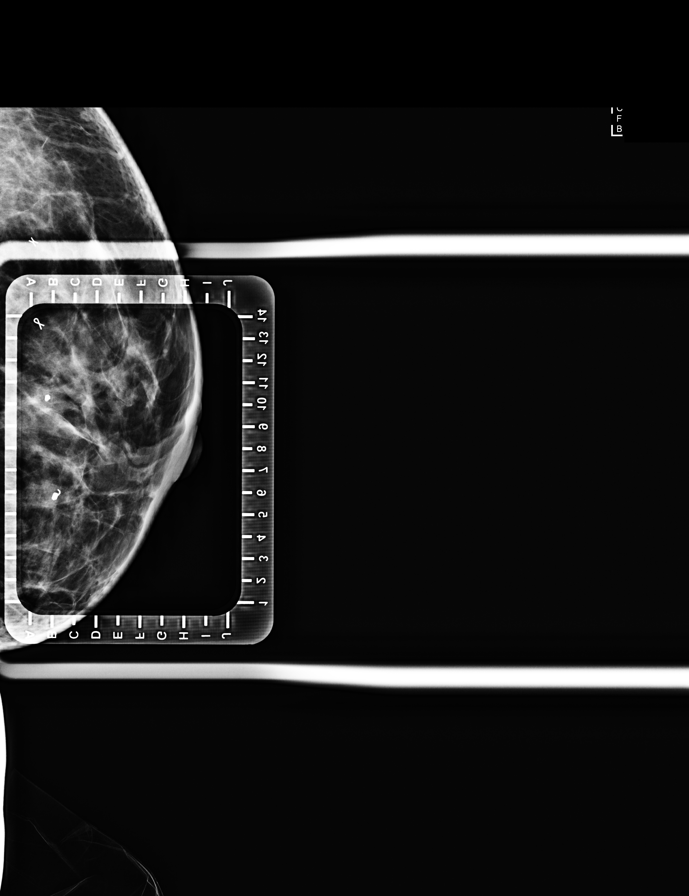

[L LM (2 of 2)]
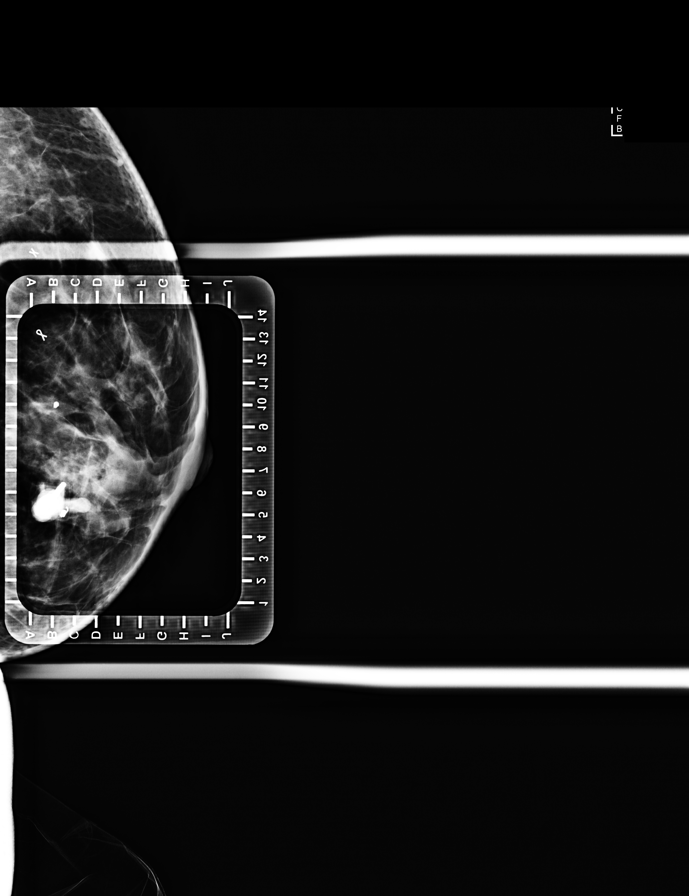

[L CC (1 of 5)]
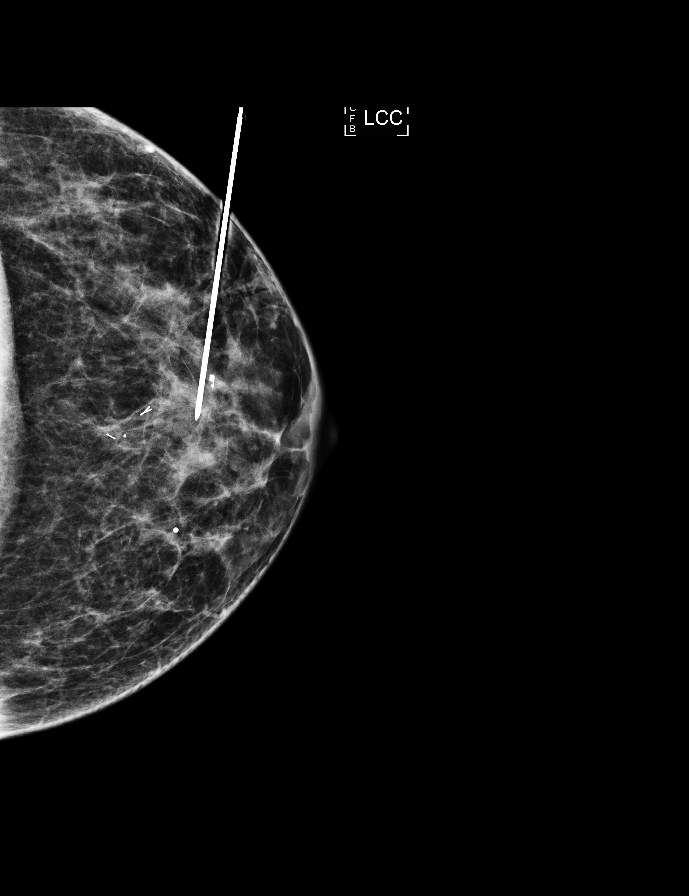

[L CC (2 of 5)]
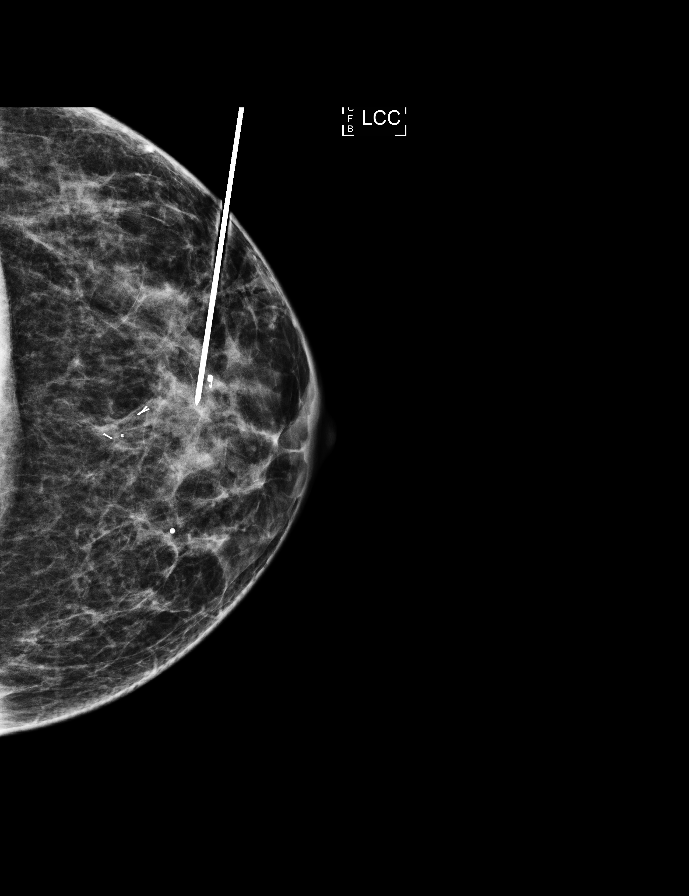

[L CC (3 of 5)]
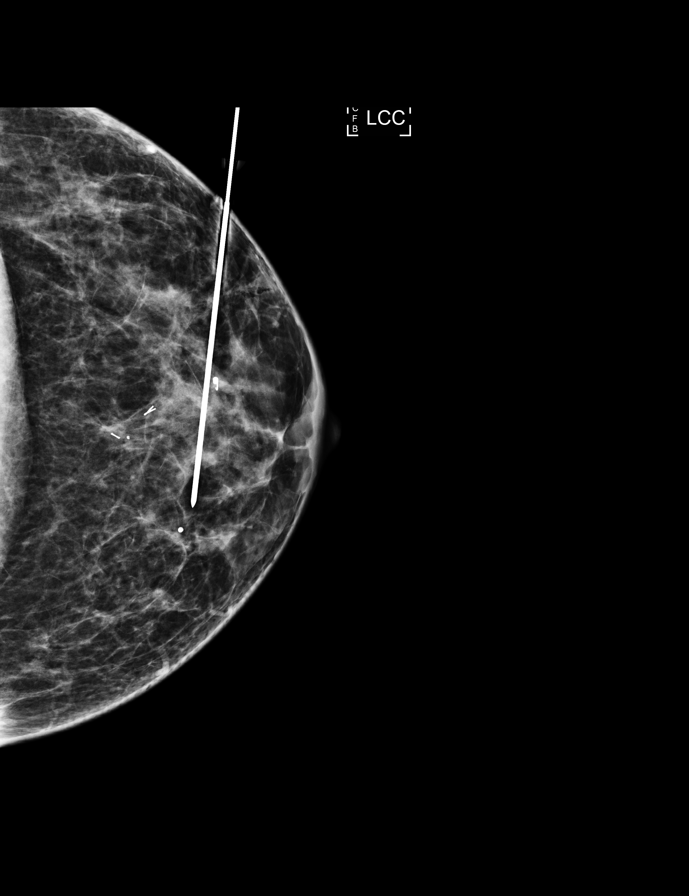

[L CC (4 of 5)]
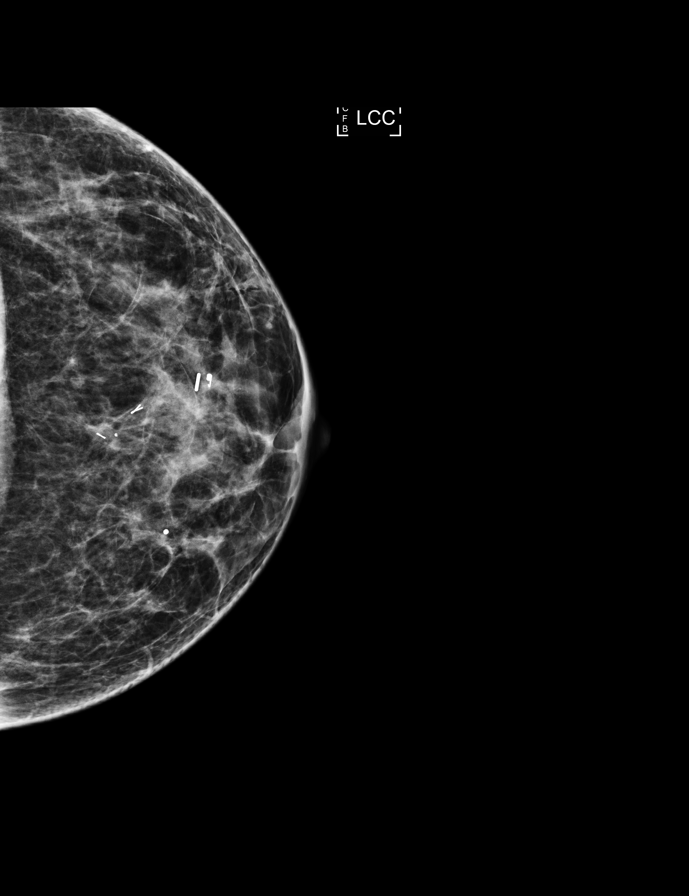

[L CC (5 of 5)]
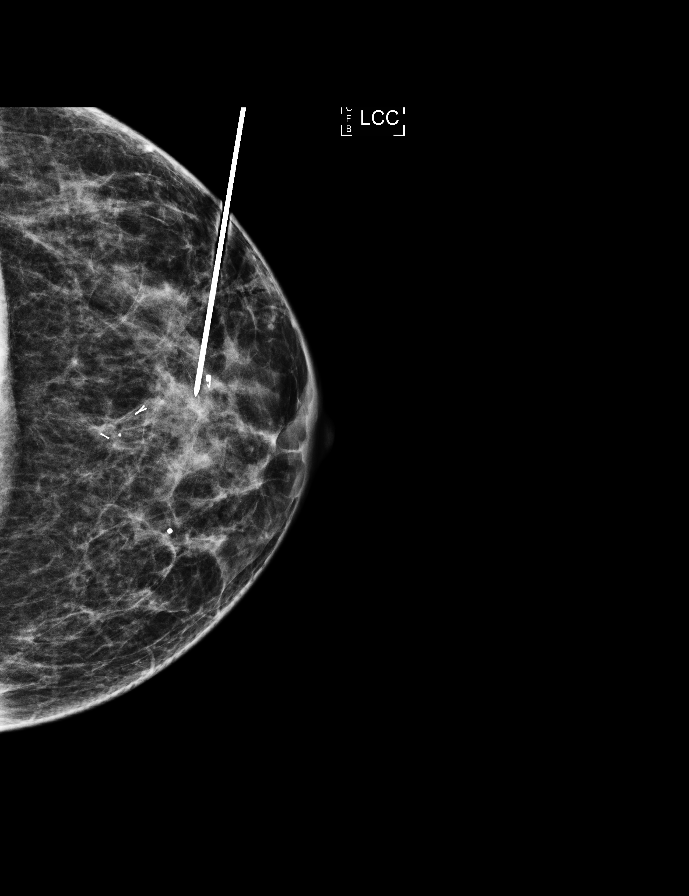

[L ML]
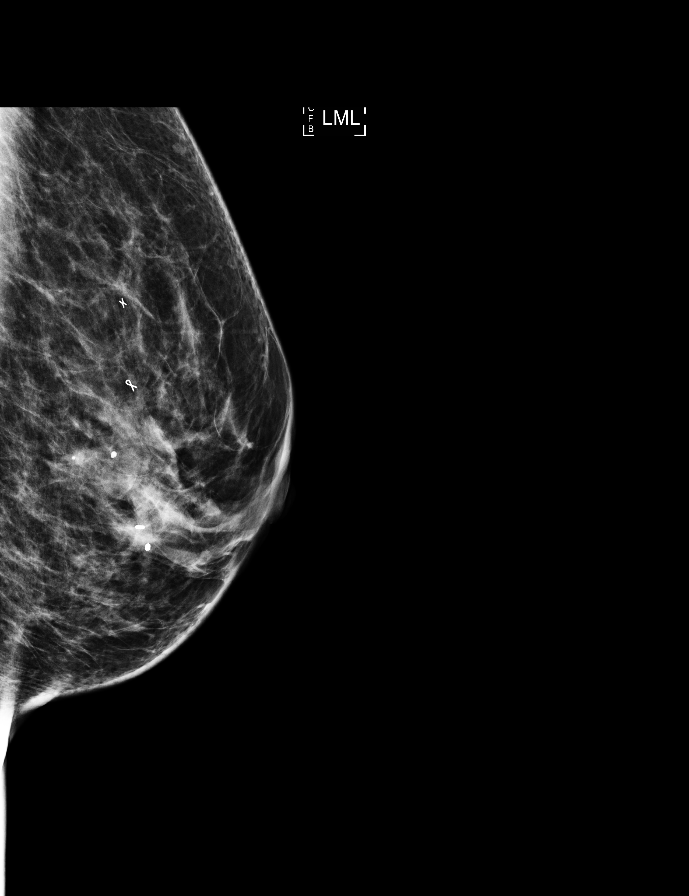

[8 of 8 positions shown; findings below may reference images not displayed]

FINDINGS: Patient presents for radioactive seed localization prior to surgical
excision. I met with the patient and we discussed the procedure of
seed localization including benefits and alternatives. We discussed
the high likelihood of a successful procedure. We discussed the
risks of the procedure including infection, bleeding, tissue injury
and further surgery. We discussed the low dose of radioactivity
involved in the procedure. Informed, written consent was given.

The usual time-out protocol was performed immediately prior to the
procedure.

Using mammographic guidance, sterile technique, 1% lidocaine and an
[id number] radioactive seed, the coil shaped clip within the outer LEFT
breast was localized using a lateral approach. The follow-up
mammogram images confirm the seed in the expected location and were
marked for Dr. Mujuru.

Follow-up survey of the patient confirms presence of the radioactive
seed.

Order number of [id number] seed:  [id number].

Total activity:  0.258 millicuries reference Date: 05/15/2020

The patient tolerated the procedure well and was released from the
[REDACTED]. She was given instructions regarding seed removal.
IMPRESSION: Radioactive seed localization left breast. No apparent
complications.

## 2022-07-31 ENCOUNTER — Other Ambulatory Visit: Payer: Self-pay | Admitting: Obstetrics & Gynecology

## 2022-07-31 DIAGNOSIS — Z1231 Encounter for screening mammogram for malignant neoplasm of breast: Secondary | ICD-10-CM

## 2022-08-22 ENCOUNTER — Ambulatory Visit
Admission: RE | Admit: 2022-08-22 | Discharge: 2022-08-22 | Disposition: A | Discharge status: Home / Self Care | Payer: BC Managed Care – PPO | Source: Ambulatory Visit | Attending: Obstetrics & Gynecology | Admitting: Obstetrics & Gynecology

## 2022-08-22 DIAGNOSIS — Z1231 Encounter for screening mammogram for malignant neoplasm of breast: Secondary | ICD-10-CM

## 2022-08-29 DIAGNOSIS — D2272 Melanocytic nevi of left lower limb, including hip: Secondary | ICD-10-CM | POA: Diagnosis not present

## 2022-08-29 DIAGNOSIS — D225 Melanocytic nevi of trunk: Secondary | ICD-10-CM | POA: Diagnosis not present

## 2022-08-29 DIAGNOSIS — D2262 Melanocytic nevi of left upper limb, including shoulder: Secondary | ICD-10-CM | POA: Diagnosis not present

## 2022-08-29 DIAGNOSIS — D2261 Melanocytic nevi of right upper limb, including shoulder: Secondary | ICD-10-CM | POA: Diagnosis not present

## 2022-09-10 DIAGNOSIS — Z6825 Body mass index (BMI) 25.0-25.9, adult: Secondary | ICD-10-CM | POA: Diagnosis not present

## 2022-09-10 DIAGNOSIS — Z01419 Encounter for gynecological examination (general) (routine) without abnormal findings: Secondary | ICD-10-CM | POA: Diagnosis not present

## 2022-09-10 DIAGNOSIS — Z124 Encounter for screening for malignant neoplasm of cervix: Secondary | ICD-10-CM | POA: Diagnosis not present

## 2022-11-20 DIAGNOSIS — N951 Menopausal and female climacteric states: Secondary | ICD-10-CM | POA: Diagnosis not present

## 2023-01-22 DIAGNOSIS — E785 Hyperlipidemia, unspecified: Secondary | ICD-10-CM | POA: Diagnosis not present

## 2023-01-27 DIAGNOSIS — Z1389 Encounter for screening for other disorder: Secondary | ICD-10-CM | POA: Diagnosis not present

## 2023-01-27 DIAGNOSIS — R82998 Other abnormal findings in urine: Secondary | ICD-10-CM | POA: Diagnosis not present

## 2023-01-27 DIAGNOSIS — I2510 Atherosclerotic heart disease of native coronary artery without angina pectoris: Secondary | ICD-10-CM | POA: Diagnosis not present

## 2023-01-27 DIAGNOSIS — Z1331 Encounter for screening for depression: Secondary | ICD-10-CM | POA: Diagnosis not present

## 2023-01-27 DIAGNOSIS — Z0000 Encounter for general adult medical examination without abnormal findings: Secondary | ICD-10-CM | POA: Diagnosis not present

## 2023-09-16 ENCOUNTER — Other Ambulatory Visit: Payer: Self-pay | Admitting: Obstetrics and Gynecology

## 2023-09-16 DIAGNOSIS — Z01419 Encounter for gynecological examination (general) (routine) without abnormal findings: Secondary | ICD-10-CM | POA: Diagnosis not present

## 2023-09-16 DIAGNOSIS — Z1231 Encounter for screening mammogram for malignant neoplasm of breast: Secondary | ICD-10-CM

## 2023-09-16 DIAGNOSIS — Z6826 Body mass index (BMI) 26.0-26.9, adult: Secondary | ICD-10-CM | POA: Diagnosis not present

## 2023-09-25 ENCOUNTER — Ambulatory Visit
Admission: RE | Admit: 2023-09-25 | Discharge: 2023-09-25 | Disposition: A | Discharge status: Home / Self Care | Source: Ambulatory Visit | Attending: Obstetrics and Gynecology | Admitting: Obstetrics and Gynecology

## 2023-09-25 DIAGNOSIS — Z1231 Encounter for screening mammogram for malignant neoplasm of breast: Secondary | ICD-10-CM
